# Patient Record
Sex: Female | Born: 1950 | Race: White | Hispanic: No | Marital: Single
Health system: Southern US, Community
[De-identification: ages and names within clinical notes are randomized; demographics above are authoritative.]

---

## 2003-12-09 ENCOUNTER — Other Ambulatory Visit: Payer: Self-pay

## 2004-04-16 ENCOUNTER — Other Ambulatory Visit: Payer: Self-pay

## 2004-10-14 ENCOUNTER — Observation Stay: Payer: Self-pay | Admitting: Internal Medicine

## 2005-03-09 ENCOUNTER — Inpatient Hospital Stay: Payer: Self-pay | Admitting: Anesthesiology

## 2005-04-04 ENCOUNTER — Emergency Department: Payer: Self-pay | Admitting: Emergency Medicine

## 2005-05-02 ENCOUNTER — Other Ambulatory Visit: Payer: Self-pay

## 2005-05-03 ENCOUNTER — Inpatient Hospital Stay: Payer: Self-pay

## 2005-05-31 ENCOUNTER — Emergency Department: Payer: Self-pay | Admitting: Emergency Medicine

## 2005-06-03 ENCOUNTER — Emergency Department: Payer: Self-pay | Admitting: Emergency Medicine

## 2007-12-03 ENCOUNTER — Other Ambulatory Visit: Payer: Self-pay

## 2007-12-03 ENCOUNTER — Emergency Department: Payer: Self-pay | Admitting: Emergency Medicine

## 2008-04-29 ENCOUNTER — Emergency Department: Payer: Self-pay | Admitting: Emergency Medicine

## 2008-04-29 ENCOUNTER — Other Ambulatory Visit: Payer: Self-pay

## 2009-09-13 ENCOUNTER — Emergency Department: Payer: Self-pay | Admitting: Emergency Medicine

## 2010-04-14 ENCOUNTER — Ambulatory Visit: Payer: Self-pay | Admitting: Internal Medicine

## 2010-04-28 ENCOUNTER — Inpatient Hospital Stay: Payer: Self-pay | Admitting: *Deleted

## 2010-05-28 ENCOUNTER — Ambulatory Visit: Payer: Self-pay | Admitting: Internal Medicine

## 2010-06-01 ENCOUNTER — Ambulatory Visit: Payer: Self-pay | Admitting: Internal Medicine

## 2010-06-04 ENCOUNTER — Ambulatory Visit: Payer: Self-pay | Admitting: Internal Medicine

## 2010-06-11 ENCOUNTER — Ambulatory Visit: Payer: Self-pay | Admitting: Internal Medicine

## 2010-07-09 ENCOUNTER — Ambulatory Visit: Payer: Self-pay | Admitting: Internal Medicine

## 2011-06-18 ENCOUNTER — Emergency Department: Payer: Self-pay | Admitting: Emergency Medicine

## 2012-03-13 ENCOUNTER — Ambulatory Visit: Payer: Self-pay | Admitting: Internal Medicine

## 2012-03-13 LAB — PRO B NATRIURETIC PEPTIDE: B-Type Natriuretic Peptide: 338 pg/mL — ABNORMAL HIGH (ref 0–125)

## 2012-03-13 LAB — CBC
HCT: 33 % — ABNORMAL LOW (ref 35.0–47.0)
MCHC: 33.9 g/dL (ref 32.0–36.0)
Platelet: 142 10*3/uL — ABNORMAL LOW (ref 150–440)
RBC: 3.72 10*6/uL — ABNORMAL LOW (ref 3.80–5.20)
WBC: 2.5 10*3/uL — ABNORMAL LOW (ref 3.6–11.0)

## 2012-03-13 LAB — COMPREHENSIVE METABOLIC PANEL
Alkaline Phosphatase: 117 U/L (ref 50–136)
Anion Gap: 10 (ref 7–16)
BUN: 20 mg/dL — ABNORMAL HIGH (ref 7–18)
Calcium, Total: 8.4 mg/dL — ABNORMAL LOW (ref 8.5–10.1)
Chloride: 111 mmol/L — ABNORMAL HIGH (ref 98–107)
Co2: 25 mmol/L (ref 21–32)
Creatinine: 1.18 mg/dL (ref 0.60–1.30)
EGFR (Non-African Amer.): 50 — ABNORMAL LOW
Glucose: 148 mg/dL — ABNORMAL HIGH (ref 65–99)
Osmolality: 296 (ref 275–301)
Potassium: 3.6 mmol/L (ref 3.5–5.1)
Sodium: 146 mmol/L — ABNORMAL HIGH (ref 136–145)
Total Protein: 6.7 g/dL (ref 6.4–8.2)

## 2012-03-13 LAB — TROPONIN I
Troponin-I: 0.04 ng/mL
Troponin-I: 0.06 ng/mL — ABNORMAL HIGH

## 2012-03-14 ENCOUNTER — Inpatient Hospital Stay: Payer: Self-pay | Admitting: Internal Medicine

## 2012-03-15 LAB — LACTATE DEHYDROGENASE: LDH: 250 U/L — ABNORMAL HIGH (ref 84–246)

## 2012-03-15 LAB — COMPREHENSIVE METABOLIC PANEL
Albumin: 2.6 g/dL — ABNORMAL LOW (ref 3.4–5.0)
Alkaline Phosphatase: 97 U/L (ref 50–136)
Anion Gap: 9 (ref 7–16)
BUN: 17 mg/dL (ref 7–18)
Creatinine: 1.08 mg/dL (ref 0.60–1.30)
EGFR (African American): >60
Osmolality: 289 (ref 275–301)
Potassium: 3.6 mmol/L (ref 3.5–5.1)
SGOT(AST): 74 U/L — ABNORMAL HIGH (ref 15–37)
SGPT (ALT): 40 U/L

## 2012-03-15 LAB — CBC WITH DIFFERENTIAL/PLATELET
Basophil %: 1 %
Basophil: 3 %
Eosinophil %: 3.9 %
HGB: 10.3 g/dL — ABNORMAL LOW (ref 12.0–16.0)
Lymphocyte #: 0.8 10*3/uL — ABNORMAL LOW (ref 1.0–3.6)
MCH: 30.4 pg (ref 26.0–34.0)
MCHC: 34.1 g/dL (ref 32.0–36.0)
Monocyte #: 0.4 10*3/uL (ref 0.0–0.7)
Monocyte %: 12.6 %
Monocytes: 14 %
Neutrophil #: 1.5 10*3/uL (ref 1.4–6.5)
RBC: 3.4 10*6/uL — ABNORMAL LOW (ref 3.80–5.20)
RDW: 16.1 % — ABNORMAL HIGH (ref 11.5–14.5)
WBC: 2.8 10*3/uL — ABNORMAL LOW (ref 3.6–11.0)

## 2012-03-15 LAB — IRON AND TIBC
Iron Bind.Cap.(Total): 305 ug/dL (ref 250–450)
Iron Saturation: 36 %
Iron: 110 ug/dL (ref 50–170)

## 2012-03-15 LAB — FOLATE: Folic Acid: 11.2 ng/mL (ref 3.1–100.0)

## 2012-03-15 LAB — LIPID PANEL
Cholesterol: 166 mg/dL (ref 0–200)
HDL Cholesterol: 39 mg/dL — ABNORMAL LOW (ref 40–60)
Ldl Cholesterol, Calc: 108 mg/dL — ABNORMAL HIGH (ref 0–100)
Triglycerides: 93 mg/dL (ref 0–200)

## 2012-03-15 LAB — RETICULOCYTES: Absolute Retic Count: 0.104 10*6/uL — ABNORMAL HIGH (ref 0.024–0.084)

## 2012-03-15 LAB — PROTIME-INR: Prothrombin Time: 15.1 secs — ABNORMAL HIGH (ref 11.5–14.7)

## 2012-03-15 LAB — FIBRINOGEN: Fibrinogen: 367 mg/dL (ref 210–470)

## 2012-03-17 LAB — BASIC METABOLIC PANEL
Anion Gap: 8 (ref 7–16)
BUN: 20 mg/dL — ABNORMAL HIGH (ref 7–18)
Calcium, Total: 8.1 mg/dL — ABNORMAL LOW (ref 8.5–10.1)
Co2: 29 mmol/L (ref 21–32)
Creatinine: 1.21 mg/dL (ref 0.60–1.30)
EGFR (African American): 58 — ABNORMAL LOW
EGFR (Non-African Amer.): 48 — ABNORMAL LOW
Glucose: 98 mg/dL (ref 65–99)
Osmolality: 286 (ref 275–301)
Potassium: 3.4 mmol/L — ABNORMAL LOW (ref 3.5–5.1)

## 2012-03-17 LAB — CBC WITH DIFFERENTIAL/PLATELET
HCT: 28.9 % — ABNORMAL LOW (ref 35.0–47.0)
HGB: 9.7 g/dL — ABNORMAL LOW (ref 12.0–16.0)
Lymphocyte #: 0.7 10*3/uL — ABNORMAL LOW (ref 1.0–3.6)
Monocyte %: 17.4 %
Platelet: 114 10*3/uL — ABNORMAL LOW (ref 150–440)
RBC: 3.25 10*6/uL — ABNORMAL LOW (ref 3.80–5.20)
RDW: 15.9 % — ABNORMAL HIGH (ref 11.5–14.5)
WBC: 2.6 10*3/uL — ABNORMAL LOW (ref 3.6–11.0)

## 2012-03-17 LAB — STOOL CULTURE

## 2012-03-28 ENCOUNTER — Ambulatory Visit: Payer: Self-pay | Admitting: Internal Medicine

## 2012-03-28 LAB — CBC CANCER CENTER
Basophil #: 0 x10 3/mm (ref 0.0–0.1)
Basophil %: 1 %
Eosinophil %: 3.9 %
HGB: 11.8 g/dL — ABNORMAL LOW (ref 12.0–16.0)
Lymphocyte #: 1 x10 3/mm (ref 1.0–3.6)
MCH: 29.4 pg (ref 26.0–34.0)
Monocyte %: 12.1 %
Neutrophil %: 60.5 %
Platelet: 147 x10 3/mm — ABNORMAL LOW (ref 150–440)
RBC: 4.01 10*6/uL (ref 3.80–5.20)

## 2012-04-12 ENCOUNTER — Ambulatory Visit: Payer: Self-pay | Admitting: Internal Medicine

## 2012-05-23 ENCOUNTER — Ambulatory Visit: Payer: Self-pay | Admitting: Emergency Medicine

## 2012-05-26 LAB — PATHOLOGY REPORT

## 2012-06-06 ENCOUNTER — Ambulatory Visit: Payer: Self-pay | Admitting: Emergency Medicine

## 2012-06-08 LAB — PATHOLOGY REPORT

## 2012-09-09 ENCOUNTER — Inpatient Hospital Stay: Payer: Self-pay | Admitting: Internal Medicine

## 2012-09-09 LAB — COMPREHENSIVE METABOLIC PANEL
Albumin: 3.1 g/dL — ABNORMAL LOW (ref 3.4–5.0)
Alkaline Phosphatase: 127 U/L (ref 50–136)
Anion Gap: 11 (ref 7–16)
Calcium, Total: 8.8 mg/dL (ref 8.5–10.1)
Chloride: 106 mmol/L (ref 98–107)
Creatinine: 0.92 mg/dL (ref 0.60–1.30)
EGFR (African American): >60
Glucose: 85 mg/dL (ref 65–99)
Potassium: 3.4 mmol/L — ABNORMAL LOW (ref 3.5–5.1)
SGOT(AST): 38 U/L — ABNORMAL HIGH (ref 15–37)
SGPT (ALT): 40 U/L (ref 12–78)

## 2012-09-09 LAB — CBC WITH DIFFERENTIAL/PLATELET
Basophil #: 0.2 10*3/uL — ABNORMAL HIGH (ref 0.0–0.1)
Basophil %: 3 %
Eosinophil %: 7.2 %
HCT: 39.4 % (ref 35.0–47.0)
HGB: 13.4 g/dL (ref 12.0–16.0)
Lymphocyte %: 32.3 %
MCH: 30.4 pg (ref 26.0–34.0)
MCV: 89 fL (ref 80–100)
Monocyte #: 0.7 x10 3/mm (ref 0.2–0.9)
Monocyte %: 10.9 %
Neutrophil %: 46.6 %
Platelet: 140 10*3/uL — ABNORMAL LOW (ref 150–440)

## 2012-09-09 LAB — TROPONIN I: Troponin-I: <0.02 ng/mL

## 2012-09-09 LAB — PRO B NATRIURETIC PEPTIDE: B-Type Natriuretic Peptide: 129 pg/mL — ABNORMAL HIGH (ref 0–125)

## 2012-09-10 LAB — BASIC METABOLIC PANEL
Anion Gap: 10 (ref 7–16)
BUN: 14 mg/dL (ref 7–18)
Chloride: 106 mmol/L (ref 98–107)
Creatinine: 0.9 mg/dL (ref 0.60–1.30)
EGFR (African American): >60
EGFR (Non-African Amer.): >60
Glucose: 130 mg/dL — ABNORMAL HIGH (ref 65–99)
Osmolality: 287 (ref 275–301)
Sodium: 143 mmol/L (ref 136–145)

## 2012-09-10 LAB — CBC WITH DIFFERENTIAL/PLATELET
Basophil #: 0 10*3/uL (ref 0.0–0.1)
Eosinophil #: 0 10*3/uL (ref 0.0–0.7)
HCT: 35.5 % (ref 35.0–47.0)
Lymphocyte %: 19.8 %
MCHC: 36.1 g/dL — ABNORMAL HIGH (ref 32.0–36.0)
Monocyte %: 2.4 %
Neutrophil #: 2.8 10*3/uL (ref 1.4–6.5)
Neutrophil %: 77.5 %
RDW: 14.4 % (ref 11.5–14.5)
WBC: 3.6 10*3/uL (ref 3.6–11.0)

## 2012-09-10 LAB — MAGNESIUM: Magnesium: 1.7 mg/dL — ABNORMAL LOW

## 2012-09-12 LAB — PLATELET COUNT: Platelet: 100 10*3/uL — ABNORMAL LOW (ref 150–440)

## 2012-09-22 ENCOUNTER — Ambulatory Visit: Payer: Self-pay | Admitting: Internal Medicine

## 2013-02-02 ENCOUNTER — Emergency Department: Payer: Self-pay | Admitting: Unknown Physician Specialty

## 2013-02-02 LAB — CBC
MCV: 88 fL (ref 80–100)
RBC: 4.14 10*6/uL (ref 3.80–5.20)
RDW: 16.4 % — ABNORMAL HIGH (ref 11.5–14.5)

## 2013-02-02 LAB — PRO B NATRIURETIC PEPTIDE: B-Type Natriuretic Peptide: 131 pg/mL — ABNORMAL HIGH (ref 0–125)

## 2013-02-02 LAB — BASIC METABOLIC PANEL
BUN: 10 mg/dL (ref 7–18)
Calcium, Total: 8.4 mg/dL — ABNORMAL LOW (ref 8.5–10.1)
Chloride: 108 mmol/L — ABNORMAL HIGH (ref 98–107)
Creatinine: 0.7 mg/dL (ref 0.60–1.30)
EGFR (Non-African Amer.): >60
Potassium: 3.8 mmol/L (ref 3.5–5.1)

## 2013-02-02 LAB — CK TOTAL AND CKMB (NOT AT ARMC): CK, Total: 65 U/L (ref 21–215)

## 2013-02-02 LAB — TROPONIN I: Troponin-I: <0.02 ng/mL

## 2013-05-30 IMAGING — US ABDOMEN ULTRASOUND LIMITED
1 series · 17 of 25 positions shown · non-contrast
Comparison: none

REASON FOR EXAM: pancytopenia, eval for cirrhosis or hepatosplenomegaly
COMMENTS:   Body Site: Liver; Spleen

[Series 1: abdomen ultrasound limited · 17 of 35 slices shown]
[im 1/35]
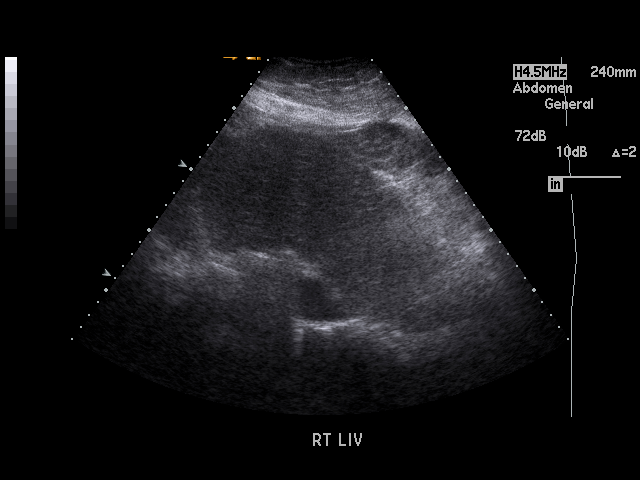
[im 3/35]
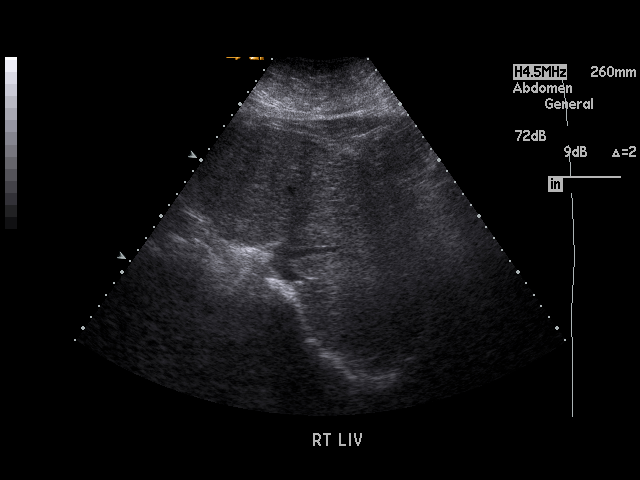
[im 5/35]
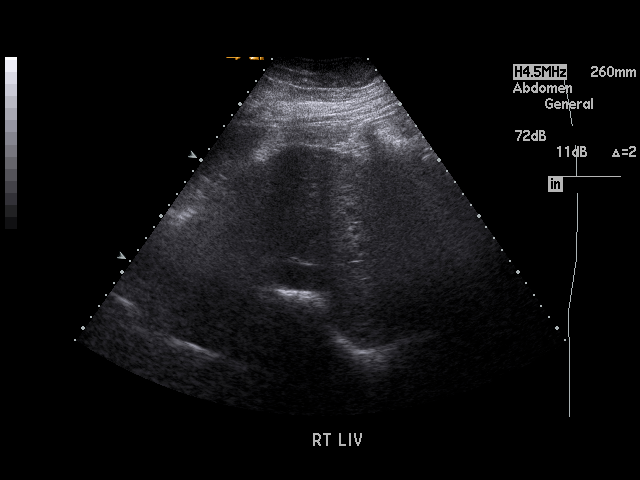
[im 8/35]
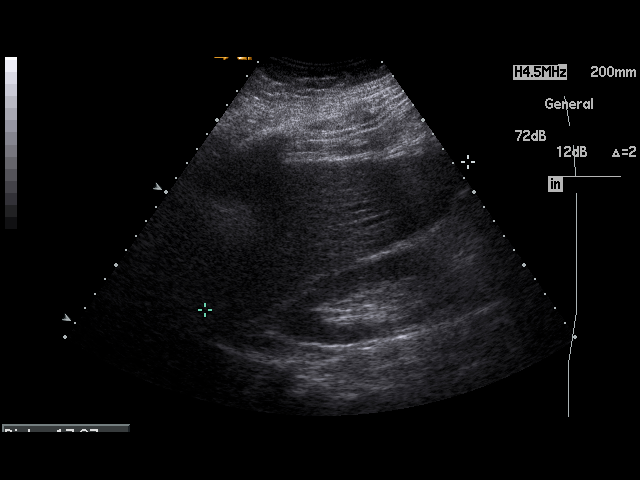
[im 9/35]
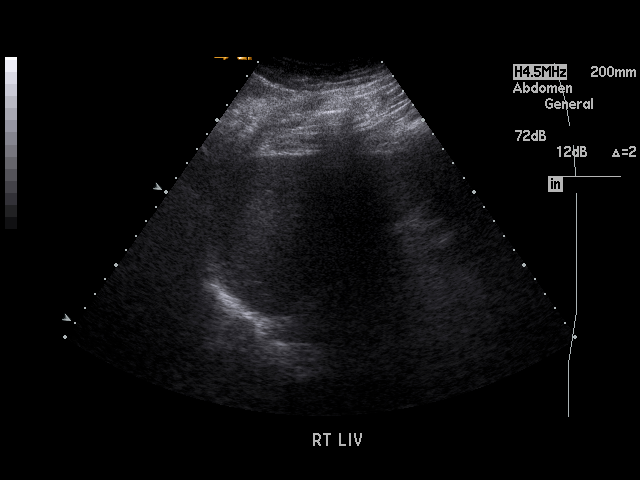
[im 12/35]
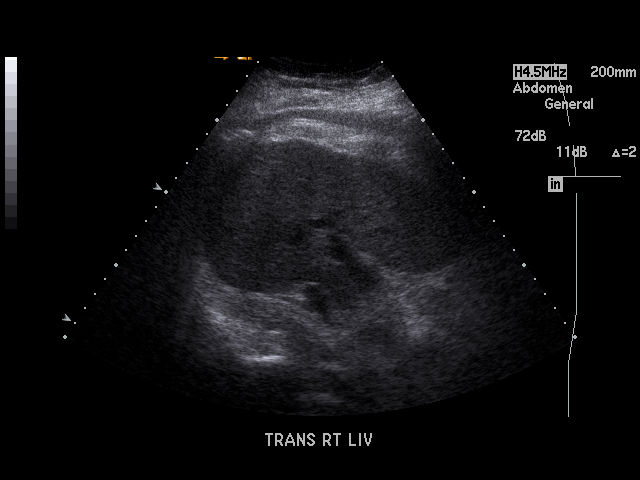
[im 13/35]
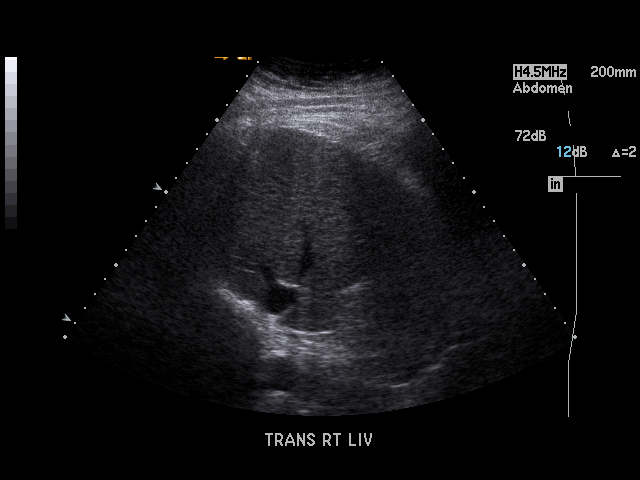
[im 16/35]
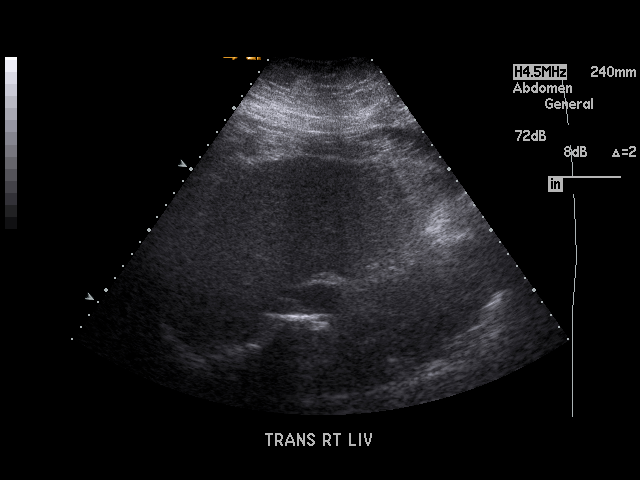
[im 18/35]
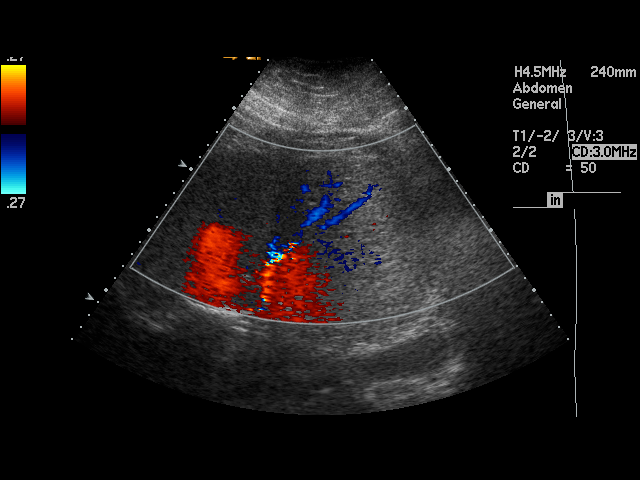
[im 19/35]
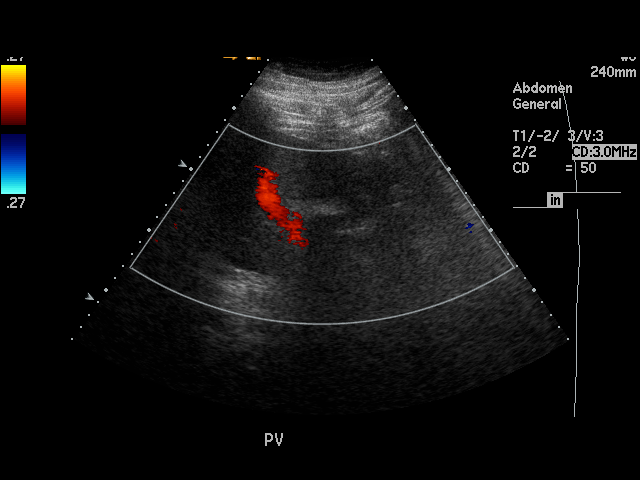
[im 22/35]
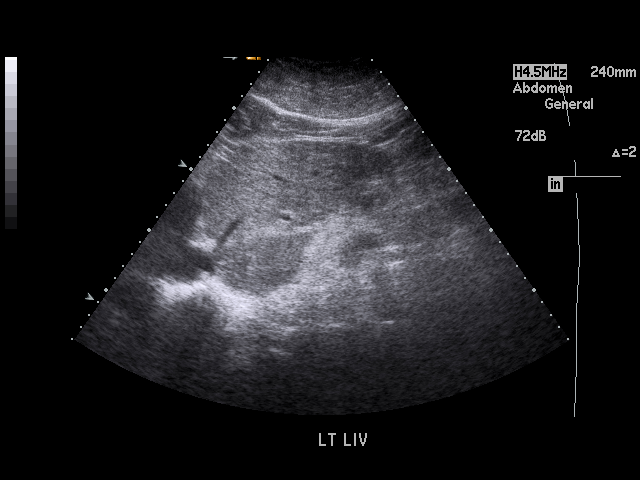
[im 23/35]
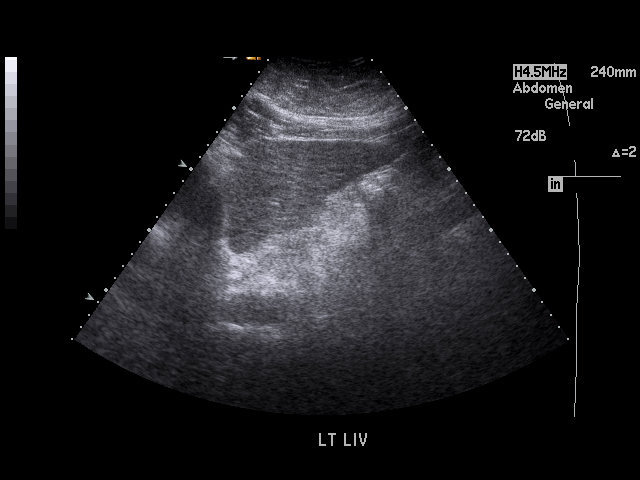
[im 26/35]
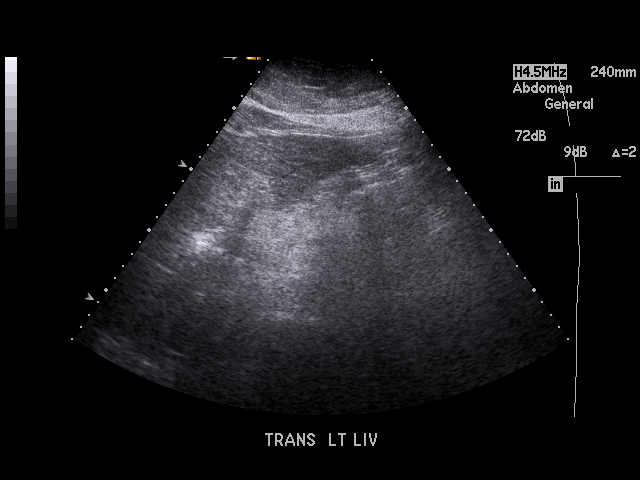
[im 27/35]
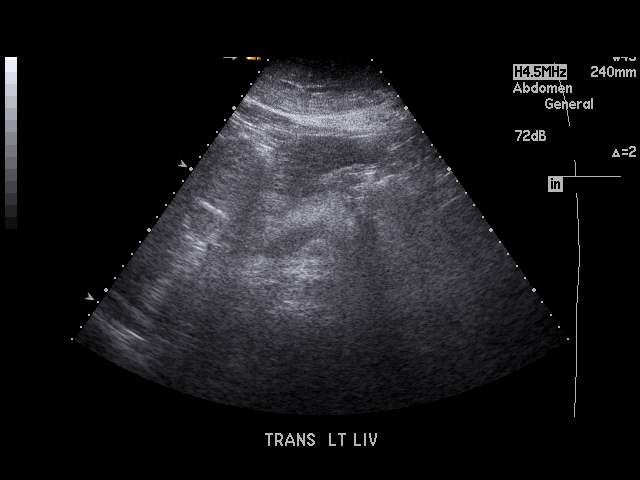
[im 30/35]
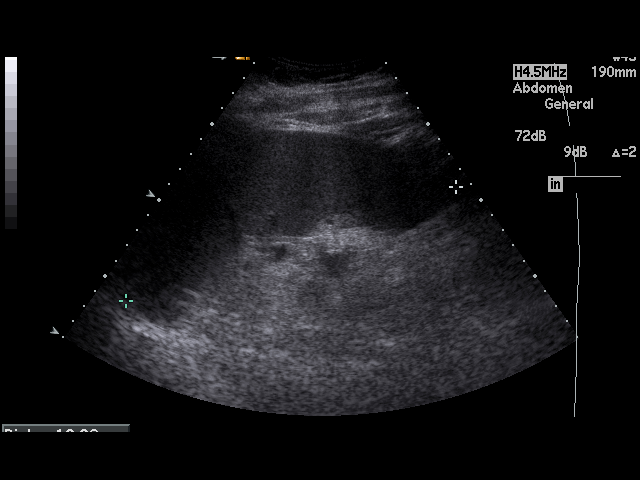
[im 32/35]
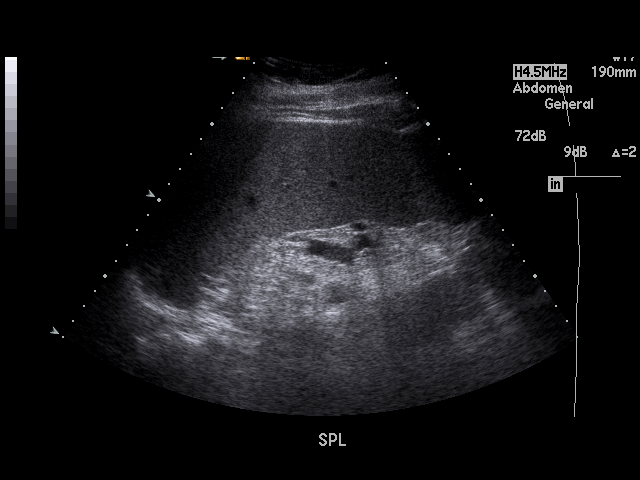
[im 35/35]
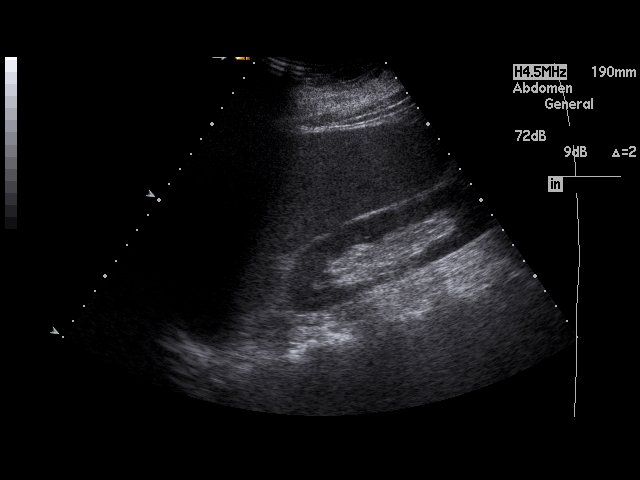

[17 of 25 positions shown; findings below may reference images not displayed]

PROCEDURE:     US  - US ABDOMEN LIMITED SURVEY  - March 15, 2012  [DATE]

RESULT:     The liver is heterogeneous in appearance with slightly lobulated
margins. The liver is within normal limits for size. The spleen is enlarged
measuring up to 18.8 cm. There is no focal hepatic mass or intrahepatic
biliary ductal dilation suggested. There does not appear to be ascites in
the visualized portions of the abdomen. This study is limited to evaluate
the spleen and liver.
IMPRESSION: 1. Heterogeneous echotexture the liver.
2. Splenomegaly.

## 2013-10-18 ENCOUNTER — Ambulatory Visit: Payer: Self-pay | Admitting: Physician Assistant

## 2014-02-20 ENCOUNTER — Inpatient Hospital Stay: Payer: Self-pay | Admitting: Internal Medicine

## 2014-02-20 LAB — CK TOTAL AND CKMB (NOT AT ARMC)
CK, Total: 57 U/L
CK-MB: 1.1 ng/mL (ref 0.5–3.6)

## 2014-02-20 LAB — BASIC METABOLIC PANEL
Anion Gap: 6 — ABNORMAL LOW (ref 7–16)
BUN: 6 mg/dL — ABNORMAL LOW (ref 7–18)
CALCIUM: 9.1 mg/dL (ref 8.5–10.1)
Chloride: 100 mmol/L (ref 98–107)
Co2: 34 mmol/L — ABNORMAL HIGH (ref 21–32)
Creatinine: 0.71 mg/dL (ref 0.60–1.30)
EGFR (African American): >60
Glucose: 86 mg/dL (ref 65–99)
OSMOLALITY: 276 (ref 275–301)
Potassium: 3 mmol/L — ABNORMAL LOW (ref 3.5–5.1)
Sodium: 140 mmol/L (ref 136–145)

## 2014-02-20 LAB — CBC
HCT: 35.8 % (ref 35.0–47.0)
HGB: 12.1 g/dL (ref 12.0–16.0)
MCH: 28.6 pg (ref 26.0–34.0)
MCHC: 33.7 g/dL (ref 32.0–36.0)
MCV: 85 fL (ref 80–100)
Platelet: 85 10*3/uL — ABNORMAL LOW (ref 150–440)
RBC: 4.21 10*6/uL (ref 3.80–5.20)
RDW: 15.7 % — AB (ref 11.5–14.5)
WBC: 4.4 10*3/uL (ref 3.6–11.0)

## 2014-02-20 LAB — TROPONIN I
Troponin-I: <0.02 ng/mL
Troponin-I: <0.02 ng/mL
Troponin-I: <0.02 ng/mL

## 2014-02-20 LAB — CK-MB
CK-MB: 1 ng/mL (ref 0.5–3.6)
CK-MB: 1 ng/mL (ref 0.5–3.6)

## 2014-02-20 LAB — AMMONIA: Ammonia, Plasma: 51 mcmol/L — ABNORMAL HIGH (ref 11–32)

## 2014-02-20 LAB — PRO B NATRIURETIC PEPTIDE: B-Type Natriuretic Peptide: 573 pg/mL — ABNORMAL HIGH (ref 0–125)

## 2014-02-20 LAB — POTASSIUM: Potassium: 3.1 mmol/L — ABNORMAL LOW (ref 3.5–5.1)

## 2014-02-20 LAB — MAGNESIUM: Magnesium: 1.3 mg/dL — ABNORMAL LOW

## 2014-02-21 LAB — CBC WITH DIFFERENTIAL/PLATELET
BASOS ABS: 0 10*3/uL (ref 0.0–0.1)
BASOS PCT: 0.1 %
EOS ABS: 0 10*3/uL (ref 0.0–0.7)
EOS PCT: 0 %
HCT: 31 % — AB (ref 35.0–47.0)
HGB: 10.5 g/dL — ABNORMAL LOW (ref 12.0–16.0)
LYMPHS ABS: 0.5 10*3/uL — AB (ref 1.0–3.6)
Lymphocyte %: 7 %
MCH: 28.7 pg (ref 26.0–34.0)
MCHC: 34 g/dL (ref 32.0–36.0)
MCV: 85 fL (ref 80–100)
MONO ABS: 0.4 x10 3/mm (ref 0.2–0.9)
MONOS PCT: 4.8 %
Neutrophil #: 6.5 10*3/uL (ref 1.4–6.5)
Neutrophil %: 88.1 %
PLATELETS: 82 10*3/uL — AB (ref 150–440)
RBC: 3.66 10*6/uL — ABNORMAL LOW (ref 3.80–5.20)
RDW: 16 % — ABNORMAL HIGH (ref 11.5–14.5)
WBC: 7.3 10*3/uL (ref 3.6–11.0)

## 2014-02-21 LAB — LIPID PANEL
CHOLESTEROL: 137 mg/dL (ref 0–200)
HDL Cholesterol: 20 mg/dL — ABNORMAL LOW (ref 40–60)
Ldl Cholesterol, Calc: 101 mg/dL — ABNORMAL HIGH (ref 0–100)
Triglycerides: 81 mg/dL (ref 0–200)
VLDL Cholesterol, Calc: 16 mg/dL (ref 5–40)

## 2014-02-21 LAB — BASIC METABOLIC PANEL
Anion Gap: 6 — ABNORMAL LOW (ref 7–16)
BUN: 12 mg/dL (ref 7–18)
CHLORIDE: 105 mmol/L (ref 98–107)
CREATININE: 0.88 mg/dL (ref 0.60–1.30)
Calcium, Total: 8.2 mg/dL — ABNORMAL LOW (ref 8.5–10.1)
Co2: 27 mmol/L (ref 21–32)
EGFR (Non-African Amer.): >60
Glucose: 147 mg/dL — ABNORMAL HIGH (ref 65–99)
Osmolality: 278 (ref 275–301)
POTASSIUM: 3.5 mmol/L (ref 3.5–5.1)
Sodium: 138 mmol/L (ref 136–145)

## 2014-02-21 LAB — TSH: Thyroid Stimulating Horm: 0.023 u[IU]/mL — ABNORMAL LOW

## 2014-02-21 LAB — MAGNESIUM: Magnesium: 3.1 mg/dL — ABNORMAL HIGH

## 2014-05-23 ENCOUNTER — Ambulatory Visit: Payer: Self-pay | Admitting: Gastroenterology

## 2014-10-28 ENCOUNTER — Ambulatory Visit: Payer: Self-pay | Admitting: Internal Medicine

## 2014-11-08 ENCOUNTER — Emergency Department: Payer: Self-pay | Admitting: Emergency Medicine

## 2014-11-08 LAB — CBC
HCT: 39.1 % (ref 35.0–47.0)
HGB: 12.7 g/dL (ref 12.0–16.0)
MCH: 30.1 pg (ref 26.0–34.0)
MCHC: 32.5 g/dL (ref 32.0–36.0)
MCV: 93 fL (ref 80–100)
PLATELETS: 170 10*3/uL (ref 150–440)
RBC: 4.22 10*6/uL (ref 3.80–5.20)
RDW: 14 % (ref 11.5–14.5)
WBC: 10.9 10*3/uL (ref 3.6–11.0)

## 2014-11-08 LAB — TROPONIN I

## 2014-11-08 LAB — BASIC METABOLIC PANEL
Anion Gap: 7 (ref 7–16)
BUN: 8 mg/dL (ref 7–18)
CALCIUM: 8.6 mg/dL (ref 8.5–10.1)
CHLORIDE: 106 mmol/L (ref 98–107)
CO2: 30 mmol/L (ref 21–32)
Creatinine: 0.77 mg/dL (ref 0.60–1.30)
EGFR (African American): >60
Glucose: 111 mg/dL — ABNORMAL HIGH (ref 65–99)
Osmolality: 284 (ref 275–301)
POTASSIUM: 3.9 mmol/L (ref 3.5–5.1)
SODIUM: 143 mmol/L (ref 136–145)

## 2014-12-12 ENCOUNTER — Ambulatory Visit: Payer: Self-pay | Admitting: Physician Assistant

## 2014-12-19 ENCOUNTER — Inpatient Hospital Stay: Payer: Self-pay | Admitting: Internal Medicine

## 2014-12-19 LAB — CBC WITH DIFFERENTIAL/PLATELET
BASOS PCT: 0.8 %
Basophil #: 0 10*3/uL (ref 0.0–0.1)
EOS ABS: 0.2 10*3/uL (ref 0.0–0.7)
EOS PCT: 2.9 %
HCT: 39.3 % (ref 35.0–47.0)
HGB: 12.8 g/dL (ref 12.0–16.0)
Lymphocyte #: 1 10*3/uL (ref 1.0–3.6)
Lymphocyte %: 17.2 %
MCH: 29.6 pg (ref 26.0–34.0)
MCHC: 32.6 g/dL (ref 32.0–36.0)
MCV: 91 fL (ref 80–100)
MONO ABS: 1 x10 3/mm — AB (ref 0.2–0.9)
Monocyte %: 18 %
Neutrophil #: 3.4 10*3/uL (ref 1.4–6.5)
Neutrophil %: 61.1 %
PLATELETS: 121 10*3/uL — AB (ref 150–440)
RBC: 4.33 10*6/uL (ref 3.80–5.20)
RDW: 13.3 % (ref 11.5–14.5)
WBC: 5.6 10*3/uL (ref 3.6–11.0)

## 2014-12-19 LAB — COMPREHENSIVE METABOLIC PANEL
ALBUMIN: 2.3 g/dL — AB (ref 3.4–5.0)
ALT: 39 U/L
AST: 45 U/L — AB (ref 15–37)
Alkaline Phosphatase: 161 U/L — ABNORMAL HIGH
Anion Gap: 5 — ABNORMAL LOW (ref 7–16)
BUN: 11 mg/dL (ref 7–18)
Bilirubin,Total: 1.1 mg/dL — ABNORMAL HIGH (ref 0.2–1.0)
Calcium, Total: 8.4 mg/dL — ABNORMAL LOW (ref 8.5–10.1)
Chloride: 98 mmol/L (ref 98–107)
Co2: 37 mmol/L — ABNORMAL HIGH (ref 21–32)
Creatinine: 0.8 mg/dL (ref 0.60–1.30)
EGFR (African American): >60
EGFR (Non-African Amer.): >60
GLUCOSE: 108 mg/dL — AB (ref 65–99)
Osmolality: 279 (ref 275–301)
Potassium: 3 mmol/L — ABNORMAL LOW (ref 3.5–5.1)
Sodium: 140 mmol/L (ref 136–145)
Total Protein: 5.9 g/dL — ABNORMAL LOW (ref 6.4–8.2)

## 2014-12-19 LAB — CK TOTAL AND CKMB (NOT AT ARMC)
CK, Total: 81 U/L (ref 26–192)
CK-MB: 0.8 ng/mL (ref 0.5–3.6)

## 2014-12-19 LAB — TROPONIN I: Troponin-I: 0.03 ng/mL

## 2014-12-20 LAB — COMPREHENSIVE METABOLIC PANEL
ANION GAP: 7 (ref 7–16)
AST: 43 U/L — AB (ref 15–37)
Albumin: 2 g/dL — ABNORMAL LOW (ref 3.4–5.0)
Alkaline Phosphatase: 132 U/L — ABNORMAL HIGH
BUN: 10 mg/dL (ref 7–18)
Bilirubin,Total: 0.7 mg/dL (ref 0.2–1.0)
CHLORIDE: 100 mmol/L (ref 98–107)
CO2: 32 mmol/L (ref 21–32)
Calcium, Total: 8.2 mg/dL — ABNORMAL LOW (ref 8.5–10.1)
Creatinine: 0.67 mg/dL (ref 0.60–1.30)
Glucose: 136 mg/dL — ABNORMAL HIGH (ref 65–99)
OSMOLALITY: 279 (ref 275–301)
Potassium: 3.4 mmol/L — ABNORMAL LOW (ref 3.5–5.1)
SGPT (ALT): 32 U/L
Sodium: 139 mmol/L (ref 136–145)
Total Protein: 5.3 g/dL — ABNORMAL LOW (ref 6.4–8.2)

## 2014-12-20 LAB — CBC WITH DIFFERENTIAL/PLATELET
BASOS PCT: 0.1 %
Basophil #: 0 10*3/uL (ref 0.0–0.1)
EOS PCT: 0 %
Eosinophil #: 0 10*3/uL (ref 0.0–0.7)
HCT: 36.5 % (ref 35.0–47.0)
HGB: 12 g/dL (ref 12.0–16.0)
Lymphocyte #: 0.5 10*3/uL — ABNORMAL LOW (ref 1.0–3.6)
Lymphocyte %: 18.5 %
MCH: 29.9 pg (ref 26.0–34.0)
MCHC: 32.7 g/dL (ref 32.0–36.0)
MCV: 91 fL (ref 80–100)
MONO ABS: 0.1 x10 3/mm — AB (ref 0.2–0.9)
Monocyte %: 4.3 %
NEUTROS ABS: 2.2 10*3/uL (ref 1.4–6.5)
NEUTROS PCT: 77.1 %
Platelet: 111 10*3/uL — ABNORMAL LOW (ref 150–440)
RBC: 4 10*6/uL (ref 3.80–5.20)
RDW: 13.1 % (ref 11.5–14.5)
WBC: 2.9 10*3/uL — ABNORMAL LOW (ref 3.6–11.0)

## 2014-12-20 LAB — MAGNESIUM
MAGNESIUM: 1.6 mg/dL — AB
MAGNESIUM: 2 mg/dL

## 2014-12-20 LAB — POTASSIUM: Potassium: 4 mmol/L (ref 3.5–5.1)

## 2014-12-20 LAB — TSH: THYROID STIMULATING HORM: 0.591 u[IU]/mL

## 2014-12-21 LAB — BASIC METABOLIC PANEL
Anion Gap: 4 — ABNORMAL LOW (ref 7–16)
BUN: 17 mg/dL (ref 7–18)
CALCIUM: 8.3 mg/dL — AB (ref 8.5–10.1)
CHLORIDE: 104 mmol/L (ref 98–107)
CO2: 33 mmol/L — AB (ref 21–32)
CREATININE: 0.86 mg/dL (ref 0.60–1.30)
EGFR (African American): >60
EGFR (Non-African Amer.): >60
Glucose: 147 mg/dL — ABNORMAL HIGH (ref 65–99)
Osmolality: 285 (ref 275–301)
Potassium: 4 mmol/L (ref 3.5–5.1)
Sodium: 141 mmol/L (ref 136–145)

## 2014-12-23 LAB — BASIC METABOLIC PANEL
Anion Gap: 7 (ref 7–16)
BUN: 17 mg/dL (ref 7–18)
CALCIUM: 8.3 mg/dL — AB (ref 8.5–10.1)
CO2: 33 mmol/L — AB (ref 21–32)
Chloride: 101 mmol/L (ref 98–107)
Creatinine: 0.83 mg/dL (ref 0.60–1.30)
EGFR (African American): >60
GLUCOSE: 159 mg/dL — AB (ref 65–99)
Osmolality: 286 (ref 275–301)
POTASSIUM: 3.9 mmol/L (ref 3.5–5.1)
Sodium: 141 mmol/L (ref 136–145)

## 2014-12-24 LAB — BASIC METABOLIC PANEL
ANION GAP: 5 — AB (ref 7–16)
BUN: 17 mg/dL (ref 7–18)
CALCIUM: 7.9 mg/dL — AB (ref 8.5–10.1)
Chloride: 100 mmol/L (ref 98–107)
Co2: 35 mmol/L — ABNORMAL HIGH (ref 21–32)
Creatinine: 0.92 mg/dL (ref 0.60–1.30)
EGFR (Non-African Amer.): >60
Glucose: 175 mg/dL — ABNORMAL HIGH (ref 65–99)
Osmolality: 285 (ref 275–301)
Potassium: 3.8 mmol/L (ref 3.5–5.1)
Sodium: 140 mmol/L (ref 136–145)

## 2014-12-26 LAB — BASIC METABOLIC PANEL
Anion Gap: 7 (ref 7–16)
BUN: 12 mg/dL (ref 7–18)
Calcium, Total: 7.9 mg/dL — ABNORMAL LOW (ref 8.5–10.1)
Chloride: 96 mmol/L — ABNORMAL LOW (ref 98–107)
Co2: 33 mmol/L — ABNORMAL HIGH (ref 21–32)
Creatinine: 0.71 mg/dL (ref 0.60–1.30)
EGFR (African American): >60
EGFR (Non-African Amer.): >60
Glucose: 118 mg/dL — ABNORMAL HIGH (ref 65–99)
Osmolality: 273 (ref 275–301)
Potassium: 3.6 mmol/L (ref 3.5–5.1)
Sodium: 136 mmol/L (ref 136–145)

## 2014-12-26 LAB — PROTIME-INR
INR: 1.3
PROTHROMBIN TIME: 16.2 s — AB (ref 11.5–14.7)

## 2014-12-26 LAB — CBC
HCT: 39.5 % (ref 35.0–47.0)
HGB: 12.8 g/dL (ref 12.0–16.0)
MCH: 29.1 pg (ref 26.0–34.0)
MCHC: 32.3 g/dL (ref 32.0–36.0)
MCV: 90 fL (ref 80–100)
Platelet: 116 10*3/uL — ABNORMAL LOW (ref 150–440)
RBC: 4.38 10*6/uL (ref 3.80–5.20)
RDW: 13.4 % (ref 11.5–14.5)
WBC: 15.2 10*3/uL — ABNORMAL HIGH (ref 3.6–11.0)

## 2014-12-26 LAB — APTT: ACTIVATED PTT: 29.5 s (ref 23.6–35.9)

## 2014-12-26 LAB — TROPONIN I: Troponin-I: >40 ng/mL

## 2014-12-27 ENCOUNTER — Inpatient Hospital Stay: Payer: Self-pay | Admitting: Internal Medicine

## 2014-12-27 LAB — CBC WITH DIFFERENTIAL/PLATELET
BASOS ABS: 0 10*3/uL (ref 0.0–0.1)
Basophil %: 0.1 %
EOS ABS: 0 10*3/uL (ref 0.0–0.7)
EOS PCT: 0.1 %
HCT: 38.6 % (ref 35.0–47.0)
HGB: 12.7 g/dL (ref 12.0–16.0)
LYMPHS ABS: 0.4 10*3/uL — AB (ref 1.0–3.6)
Lymphocyte %: 4.1 %
MCH: 29.5 pg (ref 26.0–34.0)
MCHC: 32.8 g/dL (ref 32.0–36.0)
MCV: 90 fL (ref 80–100)
MONO ABS: 0.5 x10 3/mm (ref 0.2–0.9)
Monocyte %: 4.7 %
Neutrophil #: 9.3 10*3/uL — ABNORMAL HIGH (ref 1.4–6.5)
Neutrophil %: 91 %
Platelet: 94 10*3/uL — ABNORMAL LOW (ref 150–440)
RBC: 4.29 10*6/uL (ref 3.80–5.20)
RDW: 13.6 % (ref 11.5–14.5)
WBC: 10.3 10*3/uL (ref 3.6–11.0)

## 2014-12-27 LAB — CK-MB
CK-MB: 200.8 ng/mL — ABNORMAL HIGH (ref 0.5–3.6)
CK-MB: 208.3 ng/mL — ABNORMAL HIGH (ref 0.5–3.6)
CK-MB: 234.4 ng/mL — ABNORMAL HIGH (ref 0.5–3.6)

## 2014-12-27 LAB — TROPONIN I
Troponin-I: >40 ng/mL
Troponin-I: >40 ng/mL

## 2014-12-27 LAB — HEPARIN LEVEL (UNFRACTIONATED): Anti-Xa(Unfractionated): 0.38 IU/mL (ref 0.30–0.70)

## 2015-01-13 DEATH — deceased

## 2015-04-01 NOTE — H&P (Signed)
PATIENT NAME:  Helen Carlson, [name] MR#:  [id number] DATE OF BIRTH:  09/27/1951  DATE OF ADMISSION:  09/09/2012  PRIMARY CARE PHYSICIAN: Freda MunroSaadat Khan, MD   REFERRING PHYSICIAN: Dr. Darnelle CatalanMalinda   CHIEF COMPLAINT: Cough, sputum, shortness of breath, wheezing for two weeks and worsening for one day.   HISTORY OF PRESENT ILLNESS: The patient is a 64 year old Caucasian female with a history of chronic obstructive pulmonary disease, congestive heart failure, diastolic dysfunction, hypertension, and anxiety who presented to the ED with the above chief complaint. The patient is alert, awake, oriented in no acute distress. According to the patient, she has had a cough, wheezing, and shortness of breath for about two weeks. She went to her primary care physician, Dr. Milta DeitersKhan's office, and got prednisone, antibiotic treatment, and nebulizers without relief. In addition, the symptoms have been worsening for the past one day so she came to the ED for further evaluation. In the ED she was treated with steroid and nebulizer. Symptoms are getting better. She denies any fever or chills. No headache or dizziness. No chest pain, palpitation, orthopnea, or nocturnal dyspnea. No leg edema.   PAST MEDICAL HISTORY:  1. Chronic obstructive pulmonary disease. 2. Diastolic congestive heart failure. 3. Clostridium difficile. 4. Pancytopenia. 5. Hypertension. 6. Anxiety.   SOCIAL HISTORY: The patient was a smoker but quit years ago. Denies any alcohol drinking or illicit drugs.   PAST SURGICAL HISTORY: None.   FAMILY HISTORY: No obvious family history.   ALLERGIES: Penicillin.   HOME MEDICATIONS:  1. Xanax 0.25 mg p.o. b.i.d. p.r.n.  2. Spiriva inhaler every day. 3. Singulair 10 mg p.o. daily. 4. Omeprazole 40 mg p.o. daily.  5. Naproxen 375 mg p.o. b.i.d.  6. methocarbamol 500 mg p.o. q.8 hours p.r.n.  7. Lasix 40 mg p.o. b.i.d.  8. DuoNebs p.r.n.  9. Coreg 3.125 mg p.o. tablets twice daily.  10. Advair 250 mcg/50 mcg  inhalation 1 puff b.i.d.   REVIEW OF SYSTEMS: CONSTITUTIONAL: The patient denies any fever or chills. No headache or dizziness but has generalized weakness. EYES: No double vision, blurred vision. ENT: No epistaxis, postnasal drip, slurred speech, or dysphagia. CARDIOVASCULAR: No chest pain, palpitation, orthopnea, or nocturnal dyspnea. No leg edema. PULMONARY: Positive for cough, sputum, shortness of breath, wheezing. No hemoptysis. GI: No abdominal pain, nausea, vomiting, diarrhea. No melena or bloody stool. GU: No dysuria, hematuria, or incontinence. SKIN: No rash or jaundice. MUSCULOSKELETAL: No joint pain. No edema. ENDOCRINE: No polyuria or polydipsia. NEUROLOGY: No syncope, loss of consciousness, or seizure.   PHYSICAL EXAMINATION:   VITAL SIGNS: Temperature 97.3, blood pressure 133/63, pulse 70, respirations 20, oxygen saturation 93% on 2 liters by nasal cannula.   GENERAL: The patient is alert, awake, oriented in no acute distress.   HEENT: Pupils round, equal, reactive to light and accommodation. Moist oral mucosa. Clear oropharynx.   NECK: Supple. No JVD or carotid bruit. No lymphadenopathy. No thyromegaly.   CARDIOVASCULAR: S1, S2 regular rate and rhythm. No murmurs or gallops.   PULMONARY: Bilateral air entry. Bilateral moderate wheezing. No crackles or rales. No use of accessory muscles to breathe.   ABDOMEN: Soft, obese. No distention or tenderness. No organomegaly. Bowel sounds present.   EXTREMITIES: No edema, clubbing, or cyanosis. No calf tenderness. Bilateral strong pedal pulses.   NEUROLOGY: Alert and oriented x3. No focal deficit. Power 5/5. Sensation intact. Deep tendon reflexes 2+.   LABORATORY DATA: CBC WBC 6.3, hemoglobin 13.4, platelets 140, BUN 13, creatinine 0.92, glucose 85, sodium  145, potassium 3.4, chloride 106. Troponin less than 0.02. BNP 129.   Chest x-Whitmyer showed no acute disease of the chest area.   EKG showed sinus rhythm with premature  supraventricular complexes at 61 beats per minute.   IMPRESSION:  1. Chronic obstructive pulmonary disease exacerbation.  2. Hypokalemia.  3. Hypertension, controlled.  4. Congestive heart failure, diastolic dysfunction, stable.  5. Thrombocytopenia.  6. Obesity.   PLAN OF TREATMENT:  1. The patient will be admitted to medical floor.  2. We will continue O2 by nasal cannula.  3. Start Solu-Medrol IV and start Zithromax IVPB  4. We will continue Advair, Spiriva, Singulair, and give DuoNebs.  5. Will continue Coreg and Lasix.  6. For hypokalemia, we will give Klor-Con and follow-up BMP and magnesium level.  7. GI and DVT prophylaxis.   Discussed the patient's situation and the plan of treatment with the patient.     TIME SPENT: About 55 minutes   ____________________________ Shaune Pollack, MD qc:drc D: 09/09/2012 17:24:50 ET T: 09/10/2012 07:55:35 ET JOB#: [id number]  cc: Shaune Pollack, MD, <Dictator> Yevonne Pax, MD Shaune Pollack MD ELECTRONICALLY SIGNED 09/10/2012 20:53

## 2015-04-01 NOTE — Discharge Summary (Signed)
PATIENT NAME:  Helen Carlson, Mahreen M MR#:  [id number] DATE OF BIRTH:  Sep 23, 1951  DATE OF ADMISSION:  09/09/2012 DATE OF DISCHARGE:  09/12/2012  DISCHARGE DIAGNOSES:  1. Chronic obstructive pulmonary disease exacerbation, now improving and close to baseline.  2. Hypokalemia/hypomagnesemia, replaced and resolved.  3. Chronic congestive heart failure (diastolic dysfunction), now stable on Lasix and Coreg.   SECONDARY DIAGNOSES:  1. Chronic obstructive pulmonary disease.  2. Diastolic heart failure. 3. Clostridium difficile.  4. Pancytopenia.    5. Hypertension.  6. Anxiety.    CONSULTATIONS: None.   PROCEDURES/RADIOLOGY:  1. Chest x-Lacson on September 28th at 10:44 showed presence of small pleural effusion and/or airspace disease.   2. Chest x-Ferrelli on September 28th at 14:24 showed no focal parenchymal opacities.   History and short hospital course: The patient is a 64 year old female with the above-mentioned medical problems who was admitted for COPD. She was started on IV steroids, Zithromax, Advair, Spiriva, and nebulizer breathing treatment. Please see Dr. Nicky Pughhen's dictated history and physical for further details. She was slowly improving on above regimen. She had low electrolyte (potassium and magnesium). They were replaced and resolved. She was doing much better and was discharged home in stable condition.   On the date of discharge her vital signs were as follows: Temperature 98, heart rate 56 per minute, respirations 18 per minute, blood pressure 125/69 mmHg. She was saturating 93% on room air at rest and 90% on room air with exertion.   PERTINENT PHYSICAL EXAMINATION ON THE DATE OF DISCHARGE:  CARDIOVASCULAR: S1, S2 normal. No murmurs, rubs, or gallops. LUNGS: Clear to auscultation bilaterally. No wheezing, rales, rhonchi, or crepitation. ABDOMEN: Soft, benign. NEUROLOGIC: Nonfocal examination. All other physical examination remained at baseline.   DISCHARGE MEDICATIONS:  1. Advair 250/50 one  puff b.i.d.  2. DuoNeb every six hours as needed.  3. Coreg 3.125 mg p.o. b.i.d.  4. Lasix 40 mg p.o. b.i.d.  5. Xanax 0.25 mg p.o. b.i.d. as needed.  6. Omeprazole 40 mg p.o. daily.  7. Singulair once daily.  8. Methocarbamol 500 mg p.o. every eight hours as needed.  9. Naprosyn 375 mg p.o. b.i.d.  10. Prednisone 60 mg p.o. daily, taper 10 mg daily until finished.  11. Spiriva once daily.  12. Zithromax 500 mg p.o. daily for two more days.   DISCHARGE DIET: Low sodium.   DISCHARGE ACTIVITY: As tolerated.   DISCHARGE INSTRUCTIONS AND FOLLOW-UP:  1. The patient was instructed to [facility] 1 to 2 weeks.  2. She will need follow-up with Pulmonary, Dr. Freda MunroSaadat Khan, in 2 to 4 weeks.  3. She was instructed to use 2 liters oxygen via nasal cannula at night only.   TOTAL TIME DISCHARGING THIS PATIENT: 55 minutes.   ____________________________ Ellamae SiaVipul S. Sherryll BurgerShah, MD vss:drc D: 09/12/2012 15:53:32 ET T: 09/13/2012 11:34:03 ET JOB#: [id number]  cc: Myya Meenach S. Sherryll BurgerShah, MD, <Dictator>, [facility], Yevonne PaxSaadat A. Khan, MD Ellamae SiaVIPUL S [facility] MD ELECTRONICALLY SIGNED 09/13/2012 14:39

## 2015-04-05 NOTE — Consult Note (Signed)
Brief Consult Note: Diagnosis: 64 yo female with history of copd  admitted with shorteness of breath  Noted to be in afib with rvr. Ruled out for mi.   Patient was seen by consultant.   Recommend further assessment or treatment.   Orders entered.   Comments: Pt with copd and afib admitted with sob and afib with rvr. RUled out for mi. Rate controlled with iv cardizem. Less short of breath. Echo done witih in a year revealed preserved lv function. CXR unremarkable for chf. No chest pain. Will convert to po cardizem and transfer to telemetry. WIll defer consideratiion for long term anticoagulation until time of discharge due to relative confusion at present. OK to transfer to 2A with tele.  Electronic Signatures: Dalia HeadingFath, Ashlley Booher A (MD)  (Signed 11-Mar-15 18:06)  Authored: Brief Consult Note   Last Updated: 11-Mar-15 18:06 by Dalia HeadingFath, Roldan Laforest A (MD)

## 2015-04-05 NOTE — Discharge Summary (Signed)
PATIENT NAME:  Helen Carlson, Helen Carlson MR#:  [id number] DATE OF BIRTH:  [birth year]  DATE OF ADMISSION:  02/20/2014 DATE OF DISCHARGE:  02/21/2014  PRESENTING COMPLAINT: Shortness of breath.   DISCHARGE DIAGNOSES:  1.  Acute chronic obstructive pulmonary disease exacerbation, improved.  2.  Atrial fibrillation with rapid ventricular response. Heart rate improved. Defer anticoagulation as outpatient for cardiology recommendation.  3.  Tobacco abuse. 4.  Anxiety. 5.  History of hypertension.  6.  History of hepatitis C.  7.  Confusion, resolved.  8.  Saturation 96% room air.   CODE STATUS: Full code.   MEDICATIONS:  1.  Advair 250/50 one puff b.i.d.  2.  Coreg 3.125 b.i.d.  3.  Lasix 40 mg b.i.d.  4.  Xanax 0.25 p.o. b.i.d. as needed.  5.  Omeprazole 40 mg daily.  6.  Singulair 10 mg daily.  7.  Methocarbamol 500 mg 1 tablet every 8 hours as needed.  8.  Spiriva 18 mcg inhalation daily.  9.  Diltiazem CD 120 mg p.o. daily.  10.  Levaquin 750 p.o. daily.  11.  Prednisone taper.  12.  Aspirin 81 mg daily.   DIET: Low sodium diet.   FOLLOWUP:  1.  With Dr. Beverely RisenFozia Khan in 1 to 2 weeks.  2.  With Dr. Lady GaryFath in 1 to 2 weeks.   LABORATORY DATA: At discharge: Cholesterol 137, triglyceride 81, LDL is 101. Troponin is less than 0.02.  Magnesium is 3.1. White count is 4.4, hemoglobin and hematocrit 12.1 and 35.8.  platelet count  is 573.  CARDIOLOGY CONSULTATION: [name], MD  BRIEF SUMMARY OF HOSPITAL COURSE:  Helen Carlson is a 64 year old Caucasian female with ongoing tobacco abuse, history of COPD,  and comes in with 5-day history of shortness of breath, cough, and wheezing who was admitted with acute/chronic respiratory failure secondary to COPD exacerbation. She was started on IV Solu-Medrol, antibiotics, nebulizers around-the-clock. Her saturations were 97% on room air. No wheezing. The patient did have some confusion earlier; however, per her sisters she appears to be okay. She is not hearing  any voices. Will taper steroids quickly given her confusion. She will finish up the antibiotic course as outpatient.  1.  Atrial fibrillation with RVR. Heart rate now controlled on Coreg and Cardizem. Continue aspirin for now. Seen by Dr. Lady GaryFath. The patient will follow up as outpatient once her respiratory episodes subsides.Defer anticoagulation as outpatient.  2.  COPD exacerbation. Continue p.o. steroids with Advair, Spiriva, Singulair, DuoNebs.  3.  Hypokalemia and hypomagnesium, repleted.  4.  Hypertension, controlled.  5.  CHF,  diastolic, well compensated. Continue Coreg and Lasix as before.  6.  History of hepatitis C with thrombocytopenia, which appears chronic and stable. The patient is recommended [facility].  7.  Hospital stay otherwise remained stable. The patient remained a full code.   TIME SPENT: 40 minutes.  ____________________________ Wylie HailSona A. Allena KatzPatel, MD sap:am D: 02/22/2014 06:43:40 ET T: 02/22/2014 10:00:45 ET JOB#: [id number]  cc: [name], MD, <Dictator> [name], MD [name], MD   Willow OraSONA A Kalik Hoare MD ELECTRONICALLY SIGNED 03/01/2014 10:42

## 2015-04-05 NOTE — H&P (Signed)
PATIENT NAME:  Helen Carlson, Helen Carlson MR#:  [id number] DATE OF BIRTH:  [birth year]  DATE OF ADMISSION:  02/20/2014  PRIMARY CARE PHYSICIAN: Dr. Carolynne EdouardSadaat Khan.   REFERRING PHYSICIAN: Dr. Zenda AlpersWebster.   CHIEF COMPLAINT: Shortness of breath for five days.   HISTORY OF PRESENT ILLNESS: The patient is a 64 year old female with a past medical history of COPD, congestive heart failure, hypertension, pancytopenia and anxiety who is presenting to the ER with a chief complaint of five day history of shortness of breath. Yesterday evening the patient was extremely short of breath and wheezing. She has been coughing also. The patient is brought into the ER, she was diagnosed with acute exacerbation of COPD. She was given IV Solu-Medrol and nebulizer treatments. One dose of IV antibiotics were also given. After breathing treatments, the patient became tachycardic and went into atrial fibrillation with RVR. Cardizem 10 mg IV bolus was given with no significant improvement and eventually the patient was started on Cardizem drip. Hospitalist team is called to admit the patient. The patient has intermittent episodes of confusion. Ammonia level is ordered, which is pending. At one point, she had reported that she has hepatitis C and [facility] as an outpatient regarding her hepatitis C. No family members are available. Denies any chest pain. No abdominal pain. Denies fever.   PAST MEDICAL HISTORY: COPD, diastolic congestive heart failure. History of C. diff. pancytopenia,  hypertension, anxiety.   PAST SURGICAL HISTORY: None.  ALLERGIES: PENICILLIN.   PSYCHOSOCIAL HISTORY: Lives at home. Used to smoke, but quit smoking several years ago. No history of alcohol or illicit drug usage.   FAMILY HISTORY: Hypertension runs in her family.  REVIEW OF SYSTEMS:  CONSTITUTIONAL:  Denies fevers. Complaining of fatigue.  EYES: Denies blurry vision, double vision.  ENT: Denies epistaxis, discharge.  RESPIRATION: Denies hemoptysis,  but complaining of cough, wheezing, COPD.  CARDIOVASCULAR: No chest pain, palpitations.  GASTROINTESTINAL: Denies nausea, vomiting, diarrhea and abdominal pain.  GENITOURINARY: No dysuria.  ENDOCRINE: Denies polyuria, nocturia, thyroid problems. HEMATOLOGIC AND LYMPHATIC: No anemia, but has thrombocytopenia. No bleeding.  INTEGUMENTARY: No acne, rash, lesions.  MUSCULOSKELETAL: No joint pain in the neck and back.  NEUROLOGICAL:  Denies any vertigo, ataxia.  PSYCHIATRIC: No ADD, OCD.   PHYSICAL EXAMINATION: VITAL SIGNS: Temperature 97.9, pulse 120, respirations 22, blood pressure 128/51, pulse oximetry 96% on 3 liters.  GENERAL APPEARANCE: Not in acute distress. Moderately built and obese.  HEENT: Normocephalic, atraumatic. Pupils are equally reacting to light and accommodation. No scleral icterus. No conjunctival injection. No sinus tenderness. Nares are congested. No postnasal drip.  NECK: Supple. No JVD or thyromegaly.  LUNGS: Coarse bronchial  breath sounds, decreased air entry. Diffuse wheezing is present. No crackles.  CARDIOVASCULAR: Irregularly irregular.  GASTROINTESTINAL: Soft, obese. Bowel sounds are positive in all four quadrants. Nontender, nondistended. No hepatosplenomegaly. No masses felt.  NEUROLOGIC: Awake and alert, oriented x 2 to 3.  EXTREMITIES: No edema. No cyanosis. No clubbing.  SKIN: Warm and  dry, normal turgor. No rashes. No lesions.  MUSCULOSKELETAL: No joint effusion, tenderness, erythema.  PSYCHIATRIC: Flat mood and affect.   LABORATORY AND IMAGING STUDIES: Accu-Chek 129 and 119. BMP: Glucose 86,BNP 573, BUN 6, creatinine normal. Sodium 140, potassium 3.2, chloride 100, CO2 34, anion gap is 5 . GFR greater than 60, serum osmolality 276, calcium 9.1. Ammonia is 51, CPK MB 1.0, troponin less than 0.02 x 2. White count is normal at 4.4, hemoglobin 12.1, hematocrit 35.8, platelets 85, MCV 85.  A 12-lead EKG: Atrial fibrillation at 143 beats per minute, left axis  deviation, nonspecific intraventricular block.   Portable chest x-Ballman, no acute cardiopulmonary abnormalities seen.   ASSESSMENT AND PLAN: A 64 year old female presenting to the ER with a chief complaint of five-day history of shortness of breath, cough and wheezing will be admitted with following assessment and plan.  1. Acute respiratory failure secondary to acute illness and chronic obstructive pulmonary disease. We will admit her to Critical Care Unit. IV Solu-Medrol will be given. The patient will be on antibiotics and nebulizer treatments. We will avoid albuterol in her case as the patient went into atrial fibrillation with rapid ventricular response following albuterol treatment. The patient will be on  Atrovent nebulizer treatments q.6h. and Xopenex nebulizer treatment every 4 to 6 hours as needed for shortness of breath.  2. Atrial fibrillation with rapid ventricular response after albuterol nebulizers. We will admit her to Critical Care Unit and Cardizem drip. Cardiology consult is placed.  3. Thrombocytopenia, probably from hepatitis C. The patient follows up with [facility] as an outpatient. The patient was recommended to continue following up with [facility]. At this time,  no bleeding or bruising. We will avoid antiplatelet medications. 4. Chronic history of congestive heart failure. Not fluid overloaded at this time. We will resume her home medications after med reconciliation.  5. Hypertension. Resume home medications. Provide gastrointestinal and deep vein thrombosis prophylaxis with SCDs in view of thrombocytopenia.   TOTAL CRITICAL CARE TIME SPENT: 50 minutes.    ____________________________ Ramonita Lab, MD ag:sg D: 02/20/2014 07:39:00 ET T: 02/20/2014 08:11:43 ET JOB#: [id number]  cc: Ramonita Lab, MD, <Dictator> Ramonita Lab MD ELECTRONICALLY SIGNED 03/14/2014 2:45

## 2015-04-06 NOTE — Discharge Summary (Signed)
PATIENT NAME:  Helen Carlson, Helen Carlson MR#:  [id number] DATE OF BIRTH:  July 18, 1951  DATE OF ADMISSION:  03/14/2012 DATE OF DISCHARGE:  03/18/2012  DISCHARGE DIAGNOSES:  1. Acute diastolic congestive heart failure.  2. Elevated troponin with demand ischemia.  3. Chronic obstructive pulmonary disease exacerbation.  4. Clostridium difficile colitis.  5. Elevated liver function tests with suspected liver disease.  6. Pancytopenia  7. Hypertension.  8. Anxiety.   DISCHARGE MEDICATIONS: 1. Advair 250/50, 1 puff b.i.d.  2. Spiriva 1 puff daily.  3. DuoNebs q.6 hours p.r.n.  4. Coreg 3.125 mg b.i.d.  5. Lasix 40 mg b.i.d.  6. Xanax 0.25 mg b.i.d. p.r.n.  7. Flagyl 400 mg q.i.d.   REASON FOR ADMISSION: This is a 64 year old female who has a history of chronic obstructive pulmonary disease. She has had increasing leg swelling and increasing shortness of breath. She has also had some chest pain so she was admitted for further evaluation.   HOSPITAL COURSE:  1. Acute systolic heart failure. Patient was found to be in failure, mainly right sided probably from her pulmonary condition. Echocardiogram showed an ejection fraction of 55% with some diastolic dysfunction. She was started on beta blocker. She was diuresed, tolerated well.  2. Chronic obstructive pulmonary disease exacerbation. She was treated with bronchodilators and this resolved.  3. Clostridium difficile colitis. She started developing diarrhea after admission and stool tested positive for Clostridium difficile colitis. She did say she had been on some antibiotics recently. She is going to finish a full course of Flagyl.  4. Pancytopenia. She was seen by hematology and underwent work up. They were suspicious of liver disease consulted GI and their thoughts are possible cirrhosis and will follow up with her as an outpatient.  5. Elevated liver function tests. Again this is thought to be liver disease. She will follow up with GI as an outpatient.   6. Hypertension. Since she is now on Lasix and carvedilol we discontinued her amlodipine.   DISPOSITION: Patient is discharged in stable condition.   FOLLOW UP: She will follow up with Dr. Welton FlakesKhan in 1 to 2 weeks and also Dr. Marva PandaSkulskie.   TIME SPENT ON DISCHARGE: 35 minutes.  ____________________________ Gracelyn NurseJohn D. Johnston, MD jdj:cms D: 03/18/2012 08:07:57 ET T: 03/20/2012 12:47:27 ET JOB#: [id number]  cc: Gracelyn NurseJohn D. Johnston, MD, <Dictator> Yevonne PaxSaadat A. Khan, MD Marcina MillardAlexander Paraschos, MD Christena DeemMartin U. Skulskie, MD Maren ReamerSandeep R. Sherrlyn HockPandit, MD Gracelyn NurseJOHN D JOHNSTON MD ELECTRONICALLY SIGNED 03/30/2012 23:30

## 2015-04-06 NOTE — Consult Note (Signed)
Brief Consult Note: Diagnosis: Borderline elevated troponin, probable demand/supply ischemia, not due to ACS.   Patient was seen by consultant.   Consult note dictated.   Comments: REC  Agree with current therapy, cont diuresis, defer full dose anticoagulation, may consider functional study, could perform as out-patient when patient euvolemic.  Electronic Signatures: Marcina MillardParaschos, Nemesis Rainwater (MD)  (Signed 02-Apr-13 18:04)  Authored: Brief Consult Note   Last Updated: 02-Apr-13 18:04 by Marcina MillardParaschos, Kaiden Dardis (MD)

## 2015-04-06 NOTE — Consult Note (Signed)
PATIENT NAME:  Helen Carlson, Helen Carlson MR#:  [id number] DATE OF BIRTH:  [birth year]  DATE OF CONSULTATION:  03/17/2012  REFERRING PHYSICIAN:   CONSULTING PHYSICIAN:  Keturah Barre, NP  HISTORY OF PRESENT ILLNESS: Helen Carlson is a pleasant 64 year old Caucasian female admitted with leg swelling and possible acute coronary syndrome, due to some chest pain. Please refer to admission history and physical details. We appreciate cardiac consult and hematology consult. Helen Carlson has a history significant for chronic obstructive pulmonary disease and gastroesophageal reflux disease. Gastroenterology has been consulted at the request of Dr. Letitia Libra for evaluation of his anemia and liver disease. Dr. Janese Banks has done thorough evaluation of anemia and plans to continue this on an outpatient basis, as per his note. Regarding the patient's liver disease, she has not been aware that she had any issues with her liver. Incidentally, was found to have C-diff here- was having NVD at beginning of admission. Is being treated with Flagyl.  At present she has absolutely no GI complaints. Her lab work has demonstrated some mild elevation of AST and heterogenous-appearing liver with lobulated margins on limited ultrasound. Splenomegaly is also present. She does describe, just prior to admission, some general body swelling to include her abdomen and her legs. She states it included everything except her arms. She states that her abdomen became tight and hard, but she appeared pregnant and gained about 20 pounds in what felt like occurred overnight. This has been relieved at present and she is feeling better. She has had diuretic therapy while here in the hospital. There is no known history of liver disease or family liver disease, military service, health care work, blood transfusion, IVDU, intranasal cocaine, or incarceration. She does have a tattoo performed between the years of 1945 and 1965.  States she has very rare EtOH consumption  and denies diabetes. She states she has never had her lipids tested. No episodes of jaundice or confusion. She states her current proton pump inhibitor controls her GERD very well, feeling well today. No history of luminal evaluation. Denies bloody, black tarry stool or any type of melena.   PAST MEDICAL HISTORY:  1. Gastroesophageal reflux disease. 2. Chronic obstructive pulmonary disease. 3. Hypertension. 4. Anxiety. 5. Arthritis.   PAST SURGICAL HISTORY: None.  ALLERGIES: Penicillin.   SOCIAL HISTORY: Single, lives alone. States her granddaughter is going to move in with her soon. Currently unemployed. Smokes approximately two cigarettes daily. States greater EtOH. Denies illicits.   FAMILY HISTORY: Questionable for blood clots. Denies colorectal cancer, liver disease, or peptic ulcer disease.  REVIEW OF SYSTEMS: CONSTITUTIONAL: Negative for fever, chills. Does report some fatigue. States this is unchanged. HEENT: No complaints of headaches, sinus congestion, nasal drainage, hearing loss, tinnitus, problems swallowing, or blurry vision. RESPIRATORY: Does have history of chronic obstructive pulmonary disease. States that she has inhalers for this. She was recently treated with antibiotics and prednisone for a flare up, by her primary doctor, 2 to 3 weeks ago, and is feeling better. CARDIAC: Appreciate cardiology consult. Denies current chest pain. History significant for edema, as noted above. No known arrhythmias, palpitations, or syncope. GI: As noted above. GENITOURINARY: No dysuria, hematuria, or other complaints. ENDOCRINE: Denies history of diabetes. No hot or cold intolerances. No history of hypothyroidism. INTEGUMENTARY: No erythema, lesion, or rash. MUSCULOSKELETAL: History positive for arthritis. No unusual arthralgias or myalgias. NEUROLOGIC: No history of CVA. No complaints of numbness, fainting, dizziness, or seizures. PSYCHIATRIC: No complaints of depression or  anxiety.  LABS/STUDIES:  Most recent lab work: Glucose 90, BUN 20, creatinine 1.21, serum sodium 142, potassium 3.4, chloride 105, GFR 48, calcium 8.1, LDH 250, total protein 6, albumin 2.6, total bilirubin 0.8, ALT 97, AST 74, and ALT 40. Cardiac enzyme work-up is  now normal. PT 15.1, INR 1.2. Fibrinogen 367. Reticulocyte cell count 3.1. Iron 110. Folic acid 11.2. TIBC 305. Iron saturation 36%. B12 normal. Hepatitis B surface antigen negative Coombs test negative. WBC 2.6, hemoglobin 9.7, hematocrit 28.9, platelet count 114, and normocytic red cells with increased RDW.  Stool culture was negative but did come back positive for C. difficile on 03/15/2012 and was treated.   Abdominal ultrasound of the liver demonstrated a heterogenous liver with lobulated margins and an enlarged spleen. There was no hepatomegaly, hepatic mass, dilated duct, or ascites noted.   Did have an echocardiogram this visit with normal ventricular function. Mild mitral and tricuspid regurgitation.   PHYSICAL EXAMINATION:   VITAL SIGNS: Most recent vital signs: Temperature 98.6, pulse 71, respiratory rate 20, blood pressure 109/57, and SaO2 on room air 93%.   GENERAL: Ambulatory in room, appears comfortable, no acute distress, pleasant.   PSYCH: Mood is stable, thought logical, cooperative.   HEENT: Normocephalic, atraumatic. Sclerae are without icterus. Eyes and nares without redness, drainage, or inflammation. Oral mucosa is pink and moist.   NECK: Supple. No JVD, lymphadenopathy, or thyromegaly.   RESPIRATORY: Faint mild expiratory wheezing throughout all lung fields. No retractions. No increased work of breathing. Respirations even and unlabored.   CARDIAC: S1 and S2. Regular rate and rhythm. No MRG. Pedal edema 2+ bilaterally.   ABDOMEN: Obese, active bowel sounds x4. Soft, nontender, and nondistended. No hepatosplenomegaly, masses, hernias, peritoneal signs, rebound tenderness, or guarding.   RECTAL: Deferred.    GENITOURINARY: Deferred.   EXTREMITIES: No clubbing or cyanosis. MAEW x4. Sensation appears intact. Gait steady. 2+ pedal edema as noted.   NEUROLOGIC: Cranial nerves II through XII grossly intact. Alert and oriented x3. Speech clear. No facial droop.   SKIN: No erythema or rash. Does have some scattered ecchymosis.   IMPRESSION AND PLAN:  1. C. difficile: Feeling better, treated with Flagyl. No further recommendations at this point.  2. Liver disease: With her description of what sounds like tense ascites, splenomegaly and abnormal appearance of liver on ultrasound, concern is for cirrhosis and has risk factors for hepatitis C. Other causes could be NAFLD versus less likely inherited or autoimmune disorder. No hepatic masses were found on imaging. 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 We will order hep B antibody and carbohydrate deficient transferrin level. I have advised 2 gram sodium diet and recommend continue with diuretics. We will follow liver labs done here and recommend further liver work-up as an outpatient.   These services were provided by Vevelyn Pathristiane Dreydon Cardenas, MSN, NP-C in collaboration with Barnetta ChapelMartin Skulskie, MD.  ____________________________ Keturah Barrehristiane H. Lida Berkery, NP chl:slb D: 03/18/2012 09:18:00 ET T: 03/18/2012 09:50:48 ET JOB#: [id number]  cc: Keturah Barrehristiane H. Serenah Mill, NP, <Dictator> Eustaquio MaizeHRISTIANE H Atif Chapple FNP ELECTRONICALLY SIGNED 03/18/2012 10:36

## 2015-04-06 NOTE — Consult Note (Signed)
HEMATOLOGY followup note - still weak, overall feels better. No fever or chills.no bleeding Sxs. Eating fairalert and oriented, NAD.          vitals - afebrile, stable          abd - soft, obsese, NT          ext - edemaWBC 2.6, Hb 9.7, plts 114, ANC 1.3, Cr 99.41.  64 year old female patient with history of chronic obstructive pulmonary disease, hypertension, arthritis and gastroesophageal reflux disease who has been admitted with acute coronary syndrome, possible CHF, C.diff colitis, Pancytopenia with more prominent leukopenia of 2500, mild anemia and mild thrombocytopenia  - workup is unremarkable so far except for Ultrasound showing splenemegaly and liver with heterogenous echotexture. Patient likely has underlying liver disease and splenomegaly, will get GI opinion. Otherwise she will need further evaluation to r/o other hematological disorders. Otherwise platelet count is improving, ANC is adequate and Hb is stable. No immediate treatment is needed for mild pancytopenia at this time. Will continue to follow and make further decision once GI eval is available including decide if she would need bone marrow biopsy evaluation. Patient explained above, agreeable to this plan.     Electronic Signatures: Jonn Shingles (MD)  (Signed on 05-Apr-13 23:14)  Authored  Last Updated: 05-Apr-13 23:14 by Jonn Shingles (MD)

## 2015-04-06 NOTE — H&P (Signed)
PATIENT NAME:  Helen Carlson, SZUCH MR#:  [id number] DATE OF BIRTH:  1951-03-20  DATE OF ADMISSION:  03/14/2012  PATIENT NAME:  Helen Carlson, [name] MR#:  [id number] DATE OF BIRTH:  Jul 24, 1951  DATE OF ADMISSION:  03/14/2012  CHIEF COMPLAINT: Leg swelling.   HISTORY OF PRESENT ILLNESS: This is a 64 year old pleasant female with a past medical history of hypertension and chronic obstructive pulmonary disease who comes in with one week's worth of leg swelling. The patient states that she has done nothing out of the ordinary in her usual routine with no increase in salty food and no change in medications. The patient states she has had an increase in shortness of breath out of baseline from her usual that is associated with chronic obstructive pulmonary disease. The patient also states that she had an episode of chest pain two to three days ago that she describes as coming and going and increased with movement especially while walking her dog. The patient states that while at rest she does not have any chest pain. Otherwise, she states she is compliant with her medications and because of the worsening leg swelling she came into the Emergency Room. She describes the chest pain as left-sided but cannot characterize the quality. She states that it does not radiate to her jaw or arm and is centrally located to the left side of her chest. Currently while here in the Emergency Room she does not complain of any chest pain.   PAST MEDICAL HISTORY: 1. Gastroesophageal reflux disease. 2. Chronic obstructive pulmonary disease. 3. Hypertension. 4. Anxiety. 5. Arthritis.  PAST SURGICAL HISTORY: None.  ALLERGIES: Penicillin.  SOCIAL HISTORY: She is a single female with two children both grown, currently unemployed. Currently still smoking cigarettes about 1 to 2 daily. Denies any illicit drugs.   FAMILY HISTORY: The patient states she has no family history, although on previous admission records is significant for blood clots.    REVIEW OF SYSTEMS: CONSTITUTIONAL: She does not report any fever or chills. EYES: No blurred vision. ENT: No hearing loss. CARDIOVASCULAR: She does report associated chest pain located to the left chest wall area, nonradiating, comes and goes, worse with exertion. PULMONARY: Has associated shortness of breath and wheezing. GI: No nausea, vomiting, or diarrhea. GU: No dysuria. ENDOCRINE: No heat or cold intolerance. INTEGUMENTARY: No rash. MUSCULOSKELETAL: Positive for arthralgia to joints. NEUROLOGIC: No numbness or weakness.   PHYSICAL EXAMINATION:  VITAL SIGNS: Blood pressure 143/67, respirations 22, pulse rate 95, temperature 97.6.   GENERAL: No apparent distress, responsive, pleasant.   HEENT: Pupils are equal, round, and reactive to light. Sclerae no icterus appreciated. Oral mucosa is moist.   NECK: Supple. No JVD appreciated. No lymphadenopathy or thyromegaly.  LUNGS: She has mild expiratory wheezing throughout both lung fields but no rales or rhonchi or crackles appreciated.   HEART: Regular rate and rhythm. S1 and S2 appreciated with no murmurs, rubs, or gallops noted.  ABDOMEN: Obese, soft, nontender, nondistended. Bowel sounds positive. No organomegaly appreciated.   EXTREMITIES: She has 2+ lower extremity edema noted.   NEUROLOGIC: Cranial nerves II through XII are intact. She is alert and oriented x4.   SKIN: She had a mild dermatitis noted on bilateral anterior lower extremities.   LABS AND STUDIES: Chest x-Deiss on my read does not show any large effusion noted or appreciable infiltrates. Troponin elevated at 0.06. WBC 2.5, 11.2, hematocrit 33, platelets 142, sodium 146, potassium 3.6, chloride 111, bicarb 25, BUN 20, creatinine 1.18, glucose 148,  calcium 8.4, AST slightly elevated at 95, ALT 47, albumin 2.9. B-type Natriuretic Peptide 338.   ASSESSMENT AND PLAN:  1. Acute coronary syndrome with noted elevated troponins while here in the Emergency Room. Will initiate  Lovenox q.12. Admit to telemetry. Will request Cardiology consultation. 2. Bilateral lower extremity edema with elevation of BNP while in the Emergency Room, likely CHF. Will discontinue amlodipine and initiate IV Lasix 40 b.i.d. with I's and O's to be monitored. Will order an echocardiogram for further evaluation and request for Cardiology consultation as well. 3. Chronic obstructive pulmonary disease. Will initiate nebulizer treatments q.6 hours for now due to noted wheezing on physical examination with outpatient meds Singulair and Advair to be held for now.  4. Gastroesophageal reflux disease. GI prophylaxis initiated.  5. Hypertension. Have discontinued amlodipine for now and is on Lasix 40 b.i.d. with carvedilol initiated. Blood pressure to be monitored for addition and/or titration as blood pressure can tolerate.  6. History of anxiety. Is on Xanax 0.25 mg p.o. b.i.d. p.r.n. as outpatient and will hold for now but may consider if warranted while inpatient.  7. Noted elevation of AST on liver function tests. Will repeat liver function tests with the next lab draw and send for Hepatitis C with the next lab draw as well.  ____________________________ Lazaro ArmsMichael C. Braylon Grenda, MD mcv:drc D: 03/14/2012 01:52:24 ET T: 03/14/2012 08:11:22 ET JOB#: [id number]  cc: Lazaro ArmsMichael C. Leomia Blake, MD, <Dictator>  Lazaro ArmsMICHAEL C Ginna Schuur MD ELECTRONICALLY SIGNED 04/13/2012 21:56

## 2015-04-06 NOTE — Consult Note (Signed)
Chief Complaint:   Subjective/Chief Complaint Please see full GI consult.  Patient admitted withACS, COPD, CHF, htn, noted to have slightly elevated ast on admission.  HBSAg negaitve, HCV RNAxPCR ordered by pmd.  Noted on us to have heterogenous echotexture with splenomegally.  No focal lesions though has a lobulated border of the liver.  Liver history reflects risk behavior remote with a partner that had hepatitis of unknown type in the setting of ivda.  Patietn also with history of tatoo.  Currently the ast is improving and , of note, a very non-specific finding.  Recommend further evaluation as o/p in fu with GI.   Electronic Signatures: Barnetta ChapelSkulskie, Rheta Hemmelgarn (MD)  (Signed Blase Mess05-Apr-13 21:44)  Authored: Chief Complaint   Last Updated: 05-Apr-13 21:44 by Barnetta ChapelSkulskie, Cameran Ahmed (MD)

## 2015-04-06 NOTE — Consult Note (Signed)
PATIENT NAME:  Helen Carlson, Helen Carlson MR#:  [id number] DATE OF BIRTH:  [birth year]  DATE OF CONSULTATION:  03/14/2012  REFERRING PHYSICIAN:  Dr. Max Sane CONSULTING PHYSICIAN:  Elam Ellis R. Ma Hillock, MD  REASON FOR CONSULTATION: Pancytopenia.   HISTORY OF PRESENT ILLNESS: Patient is a 64 year old female with past medical history significant for hypertension, chronic obstructive pulmonary disease, gastroesophageal reflux disease, anxiety, arthritis who has been admitted to hospital with progressive dyspnea on exertion worsening from her baseline symptoms along with progressive leg swelling. Patient also had some left-sided chest pain and was evaluated in ER and admitted to hospital earlier today. Currently she is resting, denies any chest pain or shortness of breath at rest but feels weak and tired. CBC done yesterday afternoon showed WBC low at 2500, hemoglobin 11.2, platelets 142, MCV normal at 89. Hematology has been consulted for low blood counts. Patient denies any known history of low blood counts in the past. She denies any known history of chronic liver disease, hepatitis or splenomegaly. She denies any major bleeding symptoms, has had easy skin bruising for a while. She denies any fevers or recurrent infections. Only prior CBC available on computer record shows that in May 2011 WBC was 6500, hemoglobin 12.9, platelets 177 with unremarkable differential.   PAST MEDICAL HISTORY/PAST SURGICAL HISTORY: As in history of present illness above.   FAMILY HISTORY: Denies any history of malignancy or hematological disorders.   SOCIAL HISTORY: Chronic smoker 1 to 2 daily, denies alcohol intake or recreational drug usage.   ALLERGIES: Penicillin.   HOME MEDICATIONS:  1. Advair Diskus 1 puff inhaler b.i.d.  2. DuoNeb SVN every six hours p.r.n.  3. Hypertension medication.  4. Meloxicam 1 tablet daily.  5. Singulair 1 tablet daily. 6. Spiriva inhaler once daily.   REVIEW OF SYSTEMS: CONSTITUTIONAL: As in  history of present illness. No fever or chills. HEENT: Currently denies any headaches, dizziness, epistaxis, ear or jaw pain. CARDIAC: As in history of present illness. Denies orthopnea or paroxysmal nocturnal dyspnea. LUNGS: Cough, dyspnea on exertion and sputum. No hemoptysis. GASTROINTESTINAL: No nausea, vomiting, or diarrhea. No bright red blood in stools or melena. GENITOURINARY: No dysuria or hematuria. MUSCULOSKELETAL: Has chronic arthritis. Denies any new bone pains. ENDOCRINE: No polyuria, polydipsia. Appetite is steady. Denies unintentional weight loss. NEUROLOGIC: No new focal weakness, seizures, or loss of consciousness. SKIN: No new rashes or pruritus. Has easy bruising.   PHYSICAL EXAMINATION:  GENERAL: Patient is weak and tired looking, resting in bed, otherwise alert and oriented, no acute distress. No icterus or pallor.   VITAL SIGNS: Temperature 98, pulse 81, respiratory rate 20, blood pressure 142/77, 92% on room air.   HEENT: Normocephalic, atraumatic. Extraocular movements intact. Sclera anicteric. No oral thrush.   NECK: Negative for lymphadenopathy.   CARDIOVASCULAR: S1, S2, regular rate and rhythm.   LUNGS: Lungs show decreased breath sounds overall, more so at bases. No rhonchi or crepitations.   ABDOMEN: Soft, obese, nontender. No hepatosplenomegaly clinically.   EXTREMITIES: 2+ edema. No cyanosis.   NEUROLOGIC: Cranial nerves intact. Moves all extremities spontaneously.   SKIN: Mild bruising.   LYMPHATICS: No adenopathy in the axillary or inguinal areas.   LABORATORY, DIAGNOSTIC, AND RADIOLOGICAL DATA: BNP 338, troponin I elevated at 0.06, WBC 2500, hemoglobin 11.2, platelets 142, MCV 89, creatinine 1.18. Liver functions show normal bilirubin. Liver enzymes unremarkable except AST of 95, albumin low at 2.9.   Chest x-Riedinger reported no acute cardiopulmonary disease.   IMPRESSION AND RECOMMENDATIONS: 64 year old female patient  with history of chronic obstructive  pulmonary disease, hypertension, arthritis and gastroesophageal reflux disease who has been admitted with progressive dyspnea on exertion along with increasing leg swelling, has elevated cardiac enzymes and BNP. CBC shows pancytopenia with more prominent leukopenia of 2500, mild anemia and mild thrombocytopenia with no obvious major bleeding symptoms. Will have to presume that low blood counts is a new finding, only CBC to compare with is from May 2011 when CBC was normal. Patient, therefore, will need further evaluation to try to find etiology of pancytopenia. Accordingly, will get CBC with manual differential and reticulocyte count to see if she has hypoproliferative marrow, B12 and folate level, iron study including iron and TIBC, folic acid, LDH/haptoglobin/direct Coombs test to look for evidence of hemolysis, PT/PTT/fibrinogen to look for evidence of DIC or coagulopathy, serum protein immunofixation and random urine immunofixation electrophoresis to look for plasma cell dyscrasia and monoclonal paraprotein, HBsAg (HCV antibody is pending.) Will also get ultrasound of the abdomen to evaluate for any obvious cirrhosis or hepatosplenomegaly. No immediate treatment is needed for mild pancytopenia at this time. Will continue to follow and make further decision once results are available including decide if she would need bone marrow biopsy evaluation. Patient explained above, agreeable to this plan.   Thank you for the referral. Please feel free to contact me if additional questions.  ____________________________ Rhett Bannister Ma Hillock, MD srp:cms D: 03/15/2012 00:00:13 ET T: 03/15/2012 05:46:03 ET JOB#: [id number]  cc: Virgie Chery R. Ma Hillock, MD, <Dictator> Alveta Heimlich MD ELECTRONICALLY SIGNED 03/15/2012 8:16

## 2015-04-06 NOTE — Consult Note (Signed)
Brief Consult Note: Diagnosis: Edema, chest pain.   Patient was seen by consultant.   Consult note dictated.   Comments: Appreciate consult for 64 y/o caucasian woman with history of COPD and GERD for anemia and liver disease. Noted and appreciate hematology consult. Also noted pending Hepatitis C RNA PCR. HBsAG negative. Noted that patient also had experiencd NVD/c-diff infection (diagnosed 4/3 and is on Flagyl): state she had antobiotics for a respiratory infection a few weeks ago. States her abdominal pain, NVD is now gone. Has had a mild elevation of AST, and heterogenous appearing liver with lobulated margins on limited US. Splenomegaly is also present. Patient describes general body swelling to include her abdomen  just prior to admission, states that her abdomen was tight and hard- that she appeared pregnant, and had lower leg edema. States she gained about 20lb in what felt like overnight. No history of known liver disease, military service, health care work, blood transfusion, IVDU, intranasal cocaine, incarceration. Does have a tattoo and was born between [id number].  States very rare etoh consumption. Denies DM, never had lipids tested. Denies family history of liver disease. No episodes of jaundice, confusion. Denies all other GI complaints- states her current PPI controls it well. Feeling better today. No history of luminal evaluation.  Impression: Liver disease: with her description of what sounds like tense ascites, splenomegaly, and abnormal appearance of liver on US, concern exists for cirrhosis: has risk factors for Hepatitis C. Other causes could be NAFLD vs. less likely inherited/autoimmune disorder. No hepatic masses found on imaging. Does appear to have alcoholic liver pattern, although states she rarely has etoh. Will order Hep B antibody and carbohydrate deficient transferrin. Have advised a 2gm Na diet and recommend continuing with diuretics. Will follow liver labs done here and  recommend further liver work up as outpatient.  Electronic Signatures: Vevelyn PatLondon, Deadrian Toya H (NP)  (Signed Blase Mess05-Apr-13 20:07)  Authored: Brief Consult Note   Last Updated: 05-Apr-13 20:07 by Keturah BarreLondon, Nyko Gell H (NP)

## 2015-04-06 NOTE — Consult Note (Signed)
PATIENT NAME:  Helen Carlson, [name] MR#:  [id number] DATE OF BIRTH:  10/26/1951  DATE OF CONSULTATION:  03/14/2012  REFERRING PHYSICIAN:  Dr. Deneen HartsValdez  CONSULTING PHYSICIAN:  Marcina MillardAlexander Annaclaire Walsworth, MD  CHIEF COMPLAINT: "I am swollen up."   HISTORY OF PRESENT ILLNESS: Patient is a 64 year old female referred for evaluation of borderline elevated troponin. Patient has a history of chronic obstructive pulmonary disease and hypertension. She reports a two-week history of increasing pedal edema. She has had intermittent chest discomfort and chronic exertional dyspnea. She [facility] where she was noted to have bilateral pedal edema. Admission labs were notable for initial troponin of 0.04 with a follow-up troponin of 0.06. CPK total and MB were normal. EKG was nondiagnostic. Patient is being treated with intravenous Lasix with diuresis and clinical improvement.   PAST MEDICAL HISTORY:  1. Chronic obstructive pulmonary disease.  2. Hypertension.  3. Gastroesophageal reflux disease   MEDICATIONS ON ADMISSION:  1. Advair Diskus 250/50, 1 puff b.i.d.  2. DuoNeb 2.5 /0.5 q.6 p.r.n.  3. Meloxicam daily.  4. Singulair daily.  5. Spiriva inhaler daily.   SOCIAL HISTORY: Patient currently lives alone. She reports that she smokes less than a pack of cigarettes a day.   FAMILY HISTORY: No immediate family history of coronary artery disease or myocardial infarction.   REVIEW OF SYSTEMS: CONSTITUTIONAL: Patient reports she has had some mild fever and chills. EYES: No blurry vision. EARS: No hearing loss. RESPIRATORY: Shortness of breath due to underlying chronic obstructive pulmonary disease. CARDIOVASCULAR: Patient has intermittent mild chest discomfort. GASTROINTESTINAL: Patient denies nausea, vomiting, diarrhea, or constipation. GENITOURINARY: Patient denies dysuria, hematuria. ENDOCRINE: Patient denies polyuria or polydipsia. MUSCULOSKELETAL: Patient denies arthralgias, myalgias. NEUROLOGICAL:  Patient denies focal muscle weakness or numbness. PSYCHOLOGICAL: Patient denies depression or anxiety.   PHYSICAL EXAMINATION:  VITAL SIGNS: Blood pressure 146/74, pulse 70, respirations 20, temperature 98.2, pulse oximetry 98%.   HEENT: Pupils equal, reactive to light and accommodation.   NECK: Supple without thyromegaly.   LUNGS: Clear.   CARDIOVASCULAR: Normal jugular venous pressure. Normal point of maximal impulse. Regular rate and rhythm. Normal S1, S2. No appreciable gallop, murmur, or rub.   ABDOMEN: Soft, nontender without hepatosplenomegaly.   EXTREMITIES: There is 2+ bilateral pedal edema extending up to below both knees.   MUSCULOSKELETAL: Normal muscle tone.   NEUROLOGIC: Patient is alert and oriented x3. Motor and sensory are both grossly intact.   IMPRESSION: 64 year old female with chronic obstructive pulmonary disease, probable venous insufficiency who presents with fluid retention and pedal edema. Patient had borderline elevated troponin which is likely due to demand supply ischemia and unlikely due to acute coronary syndrome. Echocardiogram reveals normal left ventricular function with mild concentric left ventricular hypertrophy which again would be consistent with diastolic dysfunction.   RECOMMENDATIONS:  1. Agree with overall current therapy.  2. Would defer full dose anticoagulation.  3. Continue diuresis, follow BUN and creatinine closely.  4. Counseled the patient about the importance of low sodium diet.  5. Strongly encouraged patient to stop smoking.  6. Would defer noninvasive or invasive cardiac evaluation at this time. May consider outpatient functional study once patient is euvolemic.  ____________________________ Marcina MillardAlexander Kashay Cavenaugh, MD ap:cms D: 03/14/2012 18:02:44 ET T: 03/15/2012 09:41:50 ET JOB#: [id number]  cc: Marcina MillardAlexander Tonio Seider, MD, <Dictator> Marcina MillardALEXANDER Oceana Walthall MD ELECTRONICALLY SIGNED 03/29/2012 8:46

## 2015-04-13 NOTE — H&P (Signed)
PATIENT NAME:  Helen Carlson, Helen Carlson MR#:  [id number] DATE OF BIRTH:  1950/12/21  DATE OF ADMISSION:  12/19/2014  PRIMARY CARE PHYSICIAN: Dr. Beverely Risen.   CHIEF COMPLAINT: Shortness of breath, cough.   HISTORY OF PRESENT ILLNESS: The patient is a 64 year old Caucasian female who was sent over to the hospital as a direct admission from Dr. Dalphine Handing office for progressively worsening of shortness of breath and cough. The patient is reporting that for the past 2-3 days, she has been sick, started with cold symptoms. Gradually, her shortness of breath and cough has been getting worse. She used outpatient antibiotic, doxycycline, and levofloxacin, with no significant improvement. The patient was seen by her primary care physician, Dr. Beverely Risen, today again, regarding worsening of shortness of breath and cough. She feels cold chills, but denies any fever. The patient was sent over to the hospital as a direct admission. During my examination, the patient is some complaining of tightness in her chest and wheezing. She has been using inhibitors and nebulizer treatments at home with no significant improvement. Chest x-Carnero has revealed no active cardiopulmonary disease. Her initial pulse ox here is at 92% on 2 liters with exertion, and 93% on 2 liters. The patient reports that she uses 2 liters of oxygen at home. Had a feeling of some chest pain when she coughs, intermittently. Denies any dizziness or loss of consciousness.   PAST MEDICAL HISTORY: History of COPD, congestive heart failure, anxiety, depression, hyperlipidemia, hypertension, hepatitis C. She follows up with gastroenterology regarding hepatitis C. Chronic atrial fibrillation, probably paroxysmal.  PAST SURGICAL HISTORY: None.   ALLERGIES: THE PATIENT IS ALLERGIC TO PENICILLIN.   PSYCHOSOCIAL HISTORY: Lives at home with her son. She used to smoke, but quit smoking several years ago. No history of alcohol or illicit drug usage.   FAMILY HISTORY:  Hypertension runs in her family.   REVIEW OF SYSTEMS:  CONSTITUTIONAL: Denies any fever. Complaining of fatigue and weakness.  EYES: Denies blurry vision, double vision.  EARS, NOSE, AND THROAT: Denies epistaxis or discharge, but complaining of nasal congestion.  RESPIRATORY: Complaining of cough, shortness of breath, wheezing. Has history of COPD.  CARDIOVASCULAR: Intermittent episodes of chest pain while coughing. Otherwise no palpitations.  GASTROINTESTINAL: Denies nausea, vomiting, diarrhea, abdominal pain.  GENITOURINARY: No dysuria or hematuria.  ENDOCRINE: Denies polyuria, nocturia, or thyroid problems.  HEMATOLOGIC AND LYMPHATIC: No anemia or easy bruising, but has thrombocytopenia. No bleeding.  INTEGUMENTARY: No acne, rash, lesions.  MUSCULOSKELETAL: No joint pain in the neck and back.  NEUROLOGIC: Denies vertigo, ataxia. No history of TIA or CVA.  PSYCHIATRIC: No ADD or OCD. Has history of anxiety.   HOME MEDICATION LIST: Advair 250/50 one puff inhalation 2 times a day, Coreg 3.125 mg p.o. 2 times a day, diltiazem 120 mg p.o. once daily, Lasix 40 mg p.o. 2 times a day, prednisone tapering dose given by her primary care physician recently, Singulair 10 mg once daily, tiotropium 18 micrograms inhalation capsule once daily, Xanax 0.25 mg 1 tablet p.o. b.i.d. as needed, erythromycin 250 mg 1 tablet p.o. once daily, which was recently given by her primary care physician.   PHYSICAL EXAMINATION:  VITAL SIGNS:  Temperature 98.3, pulse 88, respirations 18,blood pressure 147/52, pulse ox  93% on 2 liters.  GENERAL APPEARANCE: Not in acute distress. Moderately built and obese.  HEENT: Normocephalic, atraumatic. Pupils are equally reacting to light and accommodation. No scleral icterus. No conjunctival injection. No sinus tenderness. No postnasal drip. Moist mucous  membranes. NECK: Supple. No JVD. No thyromegaly. Range of motion is intact.   LUNGS: Coarse bronchial breath sounds with diffuse  wheezing. No crackles. No rales or rhonchi. CARDIOVASCULAR: Irregularly irregular. No murmurs.  GASTROINTESTINAL: Soft. Bowel sounds are positive in all 4 quadrants. Nontender, nondistended.  Some ascites is present  NEUROLOGICAL: Awake, alert, and oriented x 3. Cranial nerves II through XII are grossly intact. Motor and sensory are intact. Following verbal commands.  EXTREMITIES: 2 to 3+ pitting edema is present. No cyanosis. No clubbing.  SKIN: Warm to touch. Normal turgor. No rashes. No lesions.  LABORATORY DATA AND IMAGING STUDIES: Chest x-Anger PA and lateral views: No active cardiopulmonary disease. Mild hyperinflation again noted. A 12-lead EKG: Is ordered, which is pending at this time.   WBC 5.6, hemoglobin 12.8, hematocrit 39.3, platelets are 121. Glucose 108, BUN, creatinine, and sodium are normal. Potassium 3.0, chloride is 98, CO2 of 37, anion gap is 5, serum osmolality 229, calcium 8.4. Total protein 5.9, albumin 2.3, bilirubin total is 1.1. Alkaline phosphatase 161  AST 45, ALT 39.   ASSESSMENT AND PLAN: A 64 year old Caucasian female sent over to the hospital as a direct admission from primary care physician's office for worsening of shortness of breath and cough. The patient also reported approximately 30 pounds of weight gain with worsening of lower extremity edema. 1. Acute respiratory distress secondary to acute exacerbation of chronic obstructive pulmonary disease and acute bronchitis. Will admit her to med/surg unit. Monitor her on off unit telemetry. I will provide her Solu-Medrol 120 mg IV x 1, and we will continue Solu-Medrol 60 mg IV q. 6 hours. We will provide her DuoNebs and albuterol nebulizer treatments. We will continue Spiriva. We will provide her Flonase for nasal congestion. We will provide her ciprofloxacin for her underlying bronchitis. The patient reported that she used doxycycline and levofloxacin, but according to the reports or records, it seems like she was given  doxycycline and a Z-Pak.  2. Congestive heart failure. According to the previous echocardiogram in March 2015, normal ejection fraction, but the patient has significant lower extremity edema. We will provide her Lasix and monitor daily weights.  3. Has a chronic history of atrial fibrillation, rate controlled at this time, not on any anticoagulants. According to Dr. America BrownFath's note from previous admission, her coagulation was deferred to address as an outpatient, but I do not see any anticoagulants on her medication list. I will currently put her on aspirin, and the patient needs to follow up with cardiology regarding the anticoagulation.  4. Hypokalemia. We will replace.  5. History of obstructive sleep apnea. We will provide her CPAP at bedtime.  6. Hypertension. We will resume her home medications.  7. Hyperlipidemia. Continue statin.  8. Chronic history of hepatitis C. The patient is to follow up with gastroenterology as an outpatient as recommended.   CODE STATUS: She is full code. Sister is the medical power of attorney.   Diagnosis and plan of care were discussed in detail with the patient, and she verbalized understanding of the plan.   TOTAL TIME SPENT: 50 minutes.    ____________________________ Ramonita LabAruna Corney Knighton, MD ag:mw D: 12/19/2014 15:23:34 ET T: 12/19/2014 16:05:13 ET JOB#: [id number]  cc: Ramonita LabAruna Lorra Freeman, MD, <Dictator> Ramonita LabARUNA Aanyah Loa MD ELECTRONICALLY SIGNED 12/28/2014 15:24

## 2015-04-13 NOTE — Discharge Summary (Signed)
PATIENT NAME:  Helen Carlson, Helen Carlson MR#:  [id number] DATE OF BIRTH:  August 06, 1951  DATE OF ADMISSION:  12/27/2014 DATE OF DISCHARGE:  12/27/2014  DISPOSITION: Discharged to [facility] on January 15.   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   OTHER DIAGNOSES: Include: 1. Chronic obstructive pulmonary disease.  2. Hypertension.  3. Atrial fibrillation.     REASON FOR TRANSFER: Complicated CAD.   HOSPITAL COURSE: The patient is a 64 year old female patient with history of hepatitis C, COPD, atrial fibrillation, who follows up with Dr. Adrian BlackwaterShaukat Khan, came because of mid epigastric pain, and the patient also has shortness of breath. The patient's troponins were elevated to 40 and admitted to telemetry for non-ST-elevation MI. The patient was started on a heparin drip, and nitroglycerin and morphine for chest pain. The patient was taken to cardiac catheterization by Dr. Milta DeitersS. Khan. She was admitted last week. The patient did have a cardiac cath on January 8th when she came for chest pain. At that time, cardiac cath showed a normal EF and no significant CAD with normal EF. The patient does have 40% circumflex stenosis, but RCA was normal at that time. The patient was discharged home on the 12th. The patient had COPD exacerbation and also rapid atrial fibrillation at that time. The patient was admitted on January 7th and discharged on January 12th. The patient was discharged with Cardizem and amiodarone for atrial fibrillation. She did get some nebulizers for her COPD. Today, she came because of epigastric pain and non-ST-elevation MI. The patient was thought to have a thrombus that might have dislodged to RCA and causing RCA occlusion. The patient's cardiac cath today showed RCA 100% occlusion. The patient's EF 52%. The patient feels better, pain is better, shortness of breath is better. Troponins usually is 40. The patient's CBC was normal, and white count was up  initially at 15.2(likley due to  recent use of steroids. [facility]  [facility] treatment with an RCA occlusion treatment. The patient's other diagnoses include chronic obstructive pulmonary disease and chronic atrial fibrillation.   DISCHARGE MEDICATIONS: Heparin drip normal saline at 100 mL an hour, Xanax 0.5 mg every 6 hours Carlson.r.n. for anxiety, Cardizem CD 120 mg Carlson.o. daily, amiodarone 800 mg Carlson.o. daily. The patient got metoprolol 25 mg Carlson.o. b.i.d., Zofran 4 mg IV q. 4 hours for nausea and vomiting. The patient is also on nebulizers every 6 hours, DuoNeb.  DISCHARGE VITAL SIGNS: Temperature is 98.2, heart rate 103, blood pressure 111/81 saturations  96% on 2 liters.   [facility] Dr. Jaquita FoldsShukat khan has arrnaged the transfer.  TIME SPENT: More than 30 minutes.    ____________________________ Katha HammingSnehalatha Winry Egnew, MD sk:mw D: 12/27/2014 15:39:07 ET T: 12/27/2014 16:08:23 ET JOB#: [id number]  cc: Katha HammingSnehalatha Felix Meras, MD, <Dictator> Laurier NancyShaukat A. Khan, MD Katha HammingSNEHALATHA Betania Dizon MD ELECTRONICALLY SIGNED 01/14/2015 13:08

## 2015-04-13 NOTE — Consult Note (Signed)
PATIENT NAME:  Helen Carlson, Helen Carlson MR#:  [id number] DATE OF BIRTH:  December 27, 1950  DATE OF CONSULTATION:  12/20/2014  REFERRING PHYSICIAN:   CONSULTING PHYSICIAN:  Alinda SierrasEileen A. Rami Waddle, PA-C  INDICATION FOR CONSULTATION: Atrial fibrillation and CHF and chest pain.   HISTORY OF PRESENT ILLNESS: This is a 64 year old Caucasian, pleasant female who presented to the hospital through direct admit from Dr. Beverely RisenFozia Khan for increased shortness of breath, cough, fatigue and weakness. She states edema, shortness of breath, cough, and 50 pound weight gain have occurred over the last month. She admits to concurrent PND, orthopnea, chest pain with radiation to left jaw and palpitations.   PAST MEDICAL HISTORY: COPD, hypertension, hyperlipidemia, chronic atrial fibrillation, CHF and hepatitis C.   FAMILY HISTORY: Maternal history of hypertension, COPD and MI at age 64.   ALLERGIES: No known drug allergies.   SOCIAL HISTORY: A 50 pack-year history, quit 3 weeks ago, using nicotine patch. No EtOH or other illicit drug use.   REVIEW OF SYSTEMS: GENERAL: Positive for fatigue and malaise.  RESPIRATORY: Positive for cough, shortness of breath with exertion, wheezing.  CARDIOVASCULAR: Positive for substernal pressure-type chest pain with radiation to left jaw, positive palpitations, PND and orthopnea. Also positive for 50 pound weight gain in the last month.   HOME MEDICATIONS: Advair 250/50 one inhalation b.i.d., Coreg 3.125 mg b.i.d., diltiazem 120 mg daily, Lasix 40 mg b.i.d., Singular 10 mg daily, tiotropium 18 mcg inhalation once daily, Xanax 0.25 mg b.i.d. Carlson.r.n.   PHYSICAL EXAMINATION: VITAL SIGNS: Temperature 97.9, pulse 107, respirations 20, blood pressure 126/87, pulse ox 96% saturation on room air.  GENERAL: A and O x3, in no acute distress. Nasal cannula oxygen in place. Obese.  HEENT: No JVD or carotid bruit.  LUNGS: Bilateral wheezes and rhonchi. No crackles appreciated.  CARDIOVASCULAR: Irregularly  irregular. No audible murmurs.  GASTROINTESTINAL: Soft, nontender. Positive bowel sounds.  EXTREMITIES: Trace to 1+ pedal edema bilaterally.   DIAGNOSTIC DATA: Creatinine 0.67. Troponin first set 0.03. TSH 0.591. WBC 2.9.   EKG shows atrial fibrillation, 99 ventricular rate, PVCs, poor R wave progression, nonspecific ST and T changes.  ASSESSMENT AND PLAN: 1.  Atrial fibrillation with rapid ventricular rate. Continue aspirin and Cardizem, will attempt to convert atrial fibrillation with 250 mg Tikosyn b.i.d. Plan for cardiac catheterization today.  2.  Congestive heart failure. Ejection fraction 75%. Grade 1 diastolic dysfunction and mild mitral regurgitation and mild tricuspid regurgitation. Advise continuation of Coreg and Lasix.   We will continue to follow. Thank you very much for this consult.   ____________________________ Alinda SierrasEileen A. Jarold Mottoomano, PA-C ear:sb D: 12/20/2014 09:01:00 ET T: 12/20/2014 09:15:12 ET JOB#: [id number]  cc: Marjean DonnaEileen A. Margarito Courseromano, PA-C, <Dictator> Alinda SierrasEILEEN A Jawad Wiacek PA ELECTRONICALLY SIGNED 12/26/2014 10:11

## 2015-04-13 NOTE — Discharge Summary (Signed)
PATIENT NAME:  Helen Carlson, SACHSE MR#:  [id number] DATE OF BIRTH:  1951/07/10  DATE OF ADMISSION:  12/19/2014 DATE OF DISCHARGE:  12/24/2014  PRIMARY CARE PHYSICIAN:  Yevonne Pax, MD.    FINAL DIAGNOSES:  1. Acute respiratory failure.  2. Chronic obstructive pulmonary disease exacerbation.  3. Chronic diastolic congestive heart failure with weight gain.  4. Essential hypertension.  5. Rapid atrial fibrillation.  6. Gastroesophageal reflux disease.   MEDICATIONS ON DISCHARGE: Include omeprazole 40 mg daily, diltiazem ER 120 mg daily; prednisone 10 mg 4 tablets day 1, three tablets day 2, two tablets day 3, one tablet days 4 and 5; oxycodone 5 mg every 6 hours as needed for moderate pain, amiodarone 200 mg 4 tablets daily, nystatin swish and swallow 5 mL every 6 hours for 10 days, nicotine patch 21 mg per chest wall daily, Lasix 40 mg twice a day, potassium chloride 20 mg twice a day, aspirin 81 mg daily, albuterol nebulizer 2.5 mg nebulizer every 4 hours as needed for wheezing, Advair HFA 115/21 two puffs twice a day, Spiriva 2.5 mcg/inhalation 2 puffs once a day. Stop taking Coreg and cyclobenzaprine.  Oxygen 2 liters nasal cannula wears at night.   DIET: Low sodium diet, regular consistency.   ACTIVITY: As tolerated.   FOLLOWUP:  With Laurier Nancy, MD on Thursday, 1 to 2 weeks with Yevonne Pax, MD.    HOSPITAL COURSE: The patient was admitted 12/19/2014 and discharged 12/24/2014. Came in with shortness of breath and cough, was admitted with acute respiratory distress requiring oxygen supplementation, high-dose Solu-Medrol and Solu-Medrol around-the-clock and was put on Cipro prophylactically. She was admitted with suspected congestive heart failure and weight gain and was started on IV Lasix.    CONSULTS DURING THE HOSPITAL COURSE: Included Laurier Nancy, MD, cardiology.   LABORATORY AND RADIOLOGICAL DATA DURING THE HOSPITAL COURSE: Included a chest x-Auletta showed no active disease,  hyperinflation.  Troponin was negative. Glucose 108, BUN 11, creatinine 0.80, sodium 140, potassium 3.0, chloride 98, CO2 of 37, calcium 8.4, alkaline phosphatase 161, ALT 39, AST 45. White blood cell count 5.6, hemoglobin and hematocrit 12.8 and 39.3, platelet count of 121,000. EKG showed atrial fibrillation, premature ventricular complexes. EKG showed an EF of 75%, elevated left atrial and left ventricular end diastolic pressures, restrictive pattern of left ventricular diastolic filling, severely dilated left atrium. Magnesium was 1.6. TSH 0.591. Cardiac catheterization negative. Creatinine upon discharge 0.92, potassium 3.8.   HOSPITAL COURSE PER PROBLEM LIST:  1. For the patient's acute respiratory failure, this has improved. The patient does not require home oxygen 24/7 with our qualifying data. She does wear oxygen at night.  2. COPD exacerbation.  The patient was given IV Solu-Medrol high-dose, switched over to a prednisone taper. Did have p.o. Cipro here.  Prescriptions for Spiriva, Advair and DuoNeb nebulizer solution were written.  3. Chronic diastolic congestive heart failure and weight gain. I do not think this was an acute exacerbation, but we treated it as such anyway with IV Lasix and then switched over to p.o. She has had a 50 pound weight gain recently so I still kept Lasix 40 mg p.o. twice a day upon discharge and gave potassium supplementation.  4. Atrial fibrillation. The patient went rapid here, was started on amiodarone drip, removed off the Cardizem and Coreg and then went back into atrial fibrillation on the day of discharge, rapid, and I needed to restart Cardizem. I gave 1 dose of 300, diltiazem ER  and sent home on 120 mg daily.   Dr. Welton FlakesKhan will follow up Thursday and likely decrease the amiodarone dose and potentially get rid of the diltiazem also.  5. Gastroesophageal reflux disease, on omeprazole.  6. Thrush, on nystatin.   TIME SPENT ON DISCHARGE: 35 minutes.   PHYSICAL  EXAMINATION ON DISCHARGE:  Please see note for the day.    ____________________________ Herschell Dimesichard J. Renae GlossWieting, MD rjw:by D: 12/24/2014 15:49:14 ET T: 12/24/2014 22:07:58 ET JOB#: [id number]  cc: Herschell Dimesichard J. Renae GlossWieting, MD, <Dictator> Yevonne PaxSaadat A. Khan, MD Laurier NancyShaukat A. Khan, MD  Salley ScarletICHARD J Ayesha Markwell MD ELECTRONICALLY SIGNED 12/25/2014 17:00

## 2015-04-13 NOTE — Consult Note (Signed)
Will correct K, and MG, and start then tykosyn and will do cath today. Also will need to move to telemetry.  Electronic Signatures: Radene KneeKhan, Orman Matsumura Ali (MD)  (Signed on 08-Jan-16 11:36)  Authored  Last Updated: 08-Jan-16 11:36 by Radene KneeKhan, Dimarco Minkin Ali (MD)

## 2015-04-13 NOTE — Consult Note (Signed)
PATIENT NAME:  Helen Carlson, Madelon P MR#:  [id number] DATE OF BIRTH:  Jan 14, 1951  DATE OF CONSULTATION:  12/27/2014  CONSULTING PHYSICIAN:  Laurier NancyShaukat A. Nicle Connole, MD  HISTORY OF PRESENT ILLNESS:  This is a 64 year old white female with a past medical history of COPD, who presented to the Emergency Room with severe shortness of breath. She was admitted to the hospital last week and she underwent cardiac catheterization last week which showed no significant coronary artery disease, normal left ventricular systolic function, but was in the hospital until this Tuesday because of COPD exacerbation.  She also had atrial fibrillation paroxysmally and had been treated with amiodarone after Tikosyn was not approved by Medicaid.  She was on 800 mg amiodarone when she was in the hospital and was tapered to 400 mg.  She left Tuesday and was seen Thursday in the morning in the office, where she complained of severe shortness of breath. EKG was done which showed atrial fibrillation with rapid ventricular response rate, no significant ST changes.  She was wheezing.  Thus, it was felt that the patient probably developed atrial fibrillation again and was added Cardizem on top of the amiodarone and amiodarone was increased to 800 a day.  She was also taking aspirin.  She was set up for a sleep study because it was felt the atrial fibrillation may be due to sleep apnea.  Last night she presented to the Emergency Room with epigastric pain associated with some shortness of breath and she was seen by the ER physician who called me and said that the patient had shortness of breath, essentially no chest pain, but he was about to discharge her because she felt it was COPD and troponin came back more than 40.  EKG was done which showed probable atrial fibrillation. There was no discernible P-waves, it was read as wide QRS complex with left axis deviation, and poor R-wave progression suggestive of anteroseptal wall myocardial infarction, but apparently  it looks more like, nonspecific intraventricular conduction delay with a heart rate of 109.  He was recommended to be admitted and started on Heparin, aspirin, and Aggrastat and continue amiodarone and diltiazem.  She at this time this morning she is denying any chest pain.   SOCIAL HISTORY: She denies EtOH abuse or smoking.   PAST MEDICAL HISTORY: Chronic obstructive pulmonary disease, hypertension, GI reflux, paroxysmal atrial fibrillation.   FAMILY HISTORY:  Mother had lymphoma. No coronary artery disease.   PHYSICAL EXAMINATION:  GENERAL: She is alert and oriented right now and not short of breath or having chest pain,   VITAL SIGNS: Blood pressure 118/81, respirations 16, pulse 103, temperature 98.2, saturation 96%  HEENT:  No JVD.  LUNGS: There is wheezing.  HEART: Tachy. Normal S1, S2. No audible murmur.  ABDOMEN: Soft, nontender, positive bowel sounds.  EXTREMITIES: No pedal edema.  NEUROLOGIC: She appears to be intact.   LABORATORY DATA:  Troponin is elevated to over 40 and CPK is 200, second set is also 40.   ASSESSMENT AND PLAN: Most likely this is in the setting of essentially no significant coronary artery disease last week, this cannot be a complication of cardiac catheterization with dissection because she was in the hospital for like 4 days after the catheterization without any chest pain or worsening of her symptoms.  She did have some wheezing for which she was treated.  In fact, on Thursday morning, she did not have chest pain. She was just short of breath and it was attributed  due to chronic obstructive pulmonary disease exacerbation for which she is being treated.  It is possible that she, due to atrial fibrillation, may have dislodged a thrombus and had gone to her coronaries which caused a non ST segment myocardial infarction. We will do an echocardiogram to evaluate her ejection fraction wall motion and to see if this might be myocarditis or perimyocarditis.  Also, we will  have to do a cardiac catheterization again.  In the meantime, continue aspirin, heparin and may add Aggrastat.     ____________________________ Laurier Nancy, MD sak:DT D: 12/27/2014 08:18:45 ET T: 12/27/2014 09:05:35 ET JOB#: [id number]  cc: Laurier Nancy, MD, <Dictator> Laurier Nancy MD ELECTRONICALLY SIGNED 01/11/2015 11:44

## 2015-04-13 NOTE — H&P (Signed)
PATIENT NAME:  Helen Carlson, Helen Carlson MR#:  [id number] DATE OF BIRTH:  1951-03-07  DATE OF ADMISSION:  12/27/2014  REFERRING PHYSICIAN:  Eartha Inch. York Cerise, MD  PRIMARY CARE PHYSICIAN:  Yevonne Pax, MD  ADMISSION DIAGNOSIS:  Non-ST elevation myocardial infarction.   HISTORY OF PRESENT ILLNESS:  This is a 64 year old Caucasian female who presents to the Emergency Department complaining of epigastric pain. The patient states the pain comes and goes but is frequently incredibly severe, albeit brief, in the middle of her chest directly inferior to her sternum. The patient denies any radiating pain into her chest or any associated shortness of breath. The patient admits to feeling nauseous and has had some episodes of diaphoresis. She recently was discharged from the hospital after an acute-on-chronic episode of respiratory failure as well as congestive heart failure. At the time of admission, the patient was 50 pounds over her dry weight. She has been on Lasix since discharge. When she saw her cardiologist today, she felt that she was completely worn out by her visit. Her overall feeling of unwellness is what brought her to the Emergency Department. She initially checked out to have no acute concerns, but a troponin drawn with her baseline Emergency Department labs incidentally showed an elevation at more than 40, which prompted the Emergency Department to call for admission.   REVIEW OF SYSTEMS: CONSTITUTIONAL:  The patient denies fever, but admits to general malaise.  EYES:  Denies blurred vision or inflammation.  EARS, NOSE, AND THROAT:  Denies tinnitus or sore throat.  RESPIRATORY:  Denies cough or shortness of breath.  CARDIOVASCULAR:  Denies chest pain or palpitations. She denies proximal nocturnal dyspnea, but admits to a history of orthopnea, although she is not experiencing this at this time.  GASTROINTESTINAL:  Admits to nausea, but denies vomiting. The patient also admits to mid epigastric pain.   GENITOURINARY:  Denies dysuria or increased frequency or hesitancy of urination.  ENDOCRINE:  Denies polyuria or polydipsia.  HEMATOLOGIC AND LYMPHATIC:  Denies easy bruising or bleeding.  INTEGUMENTARY:  Denies rashes or lesions.  MUSCULOSKELETAL:  Denies arthralgias or myalgias.  NEUROLOGIC:  Denies numbness in her extremities or dysarthria.  PSYCHIATRIC:  Denies depression or suicidal ideation.   PAST MEDICAL HISTORY:  COPD, hypertension, gastroesophageal reflux disease, as well as recent onset atrial fibrillation and possible congestive heart failure.   PAST SURGICAL HISTORY:  The patient recently underwent a heart catheterization, which showed a 75% ejection fraction and a 40% stenosis of her circumflex artery.   SOCIAL HISTORY:  The patient lives with her son and daughter-in-law. She does not smoke, drink, or do any drugs.   FAMILY HISTORY:  The patient's mother is deceased of lymphoma. She has a son who is living, but has cancer of some type in his foot.   MEDICATIONS: 1.  Advair 115/21 mcg inhalation 2 puffs inhaled b.i.d.  2.  Amiodarone 200 mg 4 tablets p.o. daily.  3.  Diltiazem 120 mg extended-release capsule 1 capsule p.o. daily.  4.  Oxycodone 5 mg 1 tab every 6 hours as needed.  5.  Potassium chloride 20 mEq 1 tablet p.o. b.i.d.   ALLERGIES:  No known drug allergies.   PERTINENT LABORATORY RESULTS AND RADIOGRAPHIC FINDINGS:  Serum glucose is 118, BUN 12, creatinine 0.71, serum sodium 136, potassium 3.6, chloride 96, bicarbonate 33, and calcium is 7.9. Troponin is more than 40. CK-MB is more than 208. White blood cell count is 15.2, hemoglobin 12.8, hematocrit 39.5, platelet count  116,000, and MCV is 90. Chest x-[place] shows findings suggestive of airway disease with no focal airspace opacities to suggest pneumonia.   PHYSICAL EXAMINATION: VITAL SIGNS:  Temperature is 98.6, pulse 117, respirations 18, blood pressure 129/74, and pulse oximetry is 94% on 2 liters of oxygen via  nasal cannula.  GENERAL:  The patient is alert and oriented. She admits that she is uncomfortable due to her position in bed, but otherwise is in no apparent distress.  HEENT:  Normocephalic, atraumatic. Pupils are equal, round, and reactive to light and accommodation. Extraocular movements are intact. Mucous membranes are moist, but the patient's eyes do appear sunken, and the patient's face has mild pallor.  NECK:  Trachea is midline. No adenopathy. Thyroid is nonpalpable and nontender.  CHEST:  Symmetric and atraumatic.  CARDIOVASCULAR:  Tachycardic rate, normal rhythm. Normal S1 and S2. No rubs, clicks, or murmurs appreciated. The patient has 2+ dorsalis pedis pulses bilaterally. She has no carotid bruits.  LUNGS:  The patient has soft expiratory wheezes in the upper lung fields more than lower bilaterally. She has normal effort and excursion.  ABDOMEN:  Positive bowel sounds. Soft, nontender, nondistended. No hepatosplenomegaly.  GENITOURINARY:  Deferred.  MUSCULOSKELETAL:  The patient moves all 4 extremities equally. There is 5/5 strength in upper and lower extremities bilaterally.  SKIN:  Warm and dry. No rashes or lesions.  EXTREMITIES:  No clubbing or cyanosis. The patient does have some nonpitting edema of both lower extremities, left more than right.  NEUROLOGIC:  Cranial nerves II through XII are grossly intact.  PSYCHIATRIC:  Mood is normal. Affect is congruent. The patient has excellent insight and judgment into her medical condition.   ASSESSMENT AND PLAN:  This is a 64 year old female admitted for non-ST elevation myocardial infarction.   1.  Non-ST elevation myocardial infarction. The patient was incidentally found to have a grossly elevated troponin. Notably, this is following a heart catheterization 6 days ago that was essentially clean from a coronary artery standpoint. She has a preserved yet elevated ejection fraction, which leads one to question the stroke volume that may  indeed be decreased. She also has some nonspecific EKG changes compared to the precatheterization EKG, but notably the patient had been started on amiodarone since that time, which makes the tracing more difficult to interpret. We have started a heparin drip, and I have ordered a repeat echocardiogram in the morning. Dr. Welton FlakesKhan has been notified and will consult for cardiology in the morning. I have also ordered a recheck of the patient's hemoglobin and hematocrit for the morning due to her facial pallor as well as potential risk for arterial dissection in light of a recent heart catheterization and current non-ST elevation myocardial infarction.  2.  Chronic obstructive pulmonary disease. The patient is currently wheezing. We will do DuoNebs every 6 hours as needed while she is awake. Continue Advair.  3.  Hypertension, controlled at this time. I have started a beta blocker, however, for cardiovascular risk reduction. Also, per my interpretation, the patient's cardiac silhouette is somewhat enlarged. This raises concern for cardiomyopathy or myocarditis given the patient's new onset congestive heart failure with her last hospitalization as well as her dramatic decline in overall health.  4.  Atrial fibrillation. This is new since the patient's last admission. Apparently, she had some episodes of rapid ventricular response, but only paroxysmal irregularity, which is presumably the reason she is not on any anticoagulants since she was discharged from the hospital. We will continue diltiazem  and amiodarone.  5.  Obesity. The patient's body mass index is 33.1. I have encouraged a balanced diet to help her lose weight and reduce risk of further cardiovascular problems.  6.  Deep vein thrombosis prophylaxis. The patient is on therapeutic heparin at this time.  7.  Gastrointestinal prophylaxis. None. The patient can have an H2-blocker prior to meals if needed.   CODE STATUS:  The patient is a full code.   Time  spent on admission orders and patient care:  Approximately 45 minutes.   ____________________________ Kelton Pillar. Sheryle Hail, MD msd:nb D: 12/27/2014 04:29:15 ET T: 12/27/2014 04:46:28 ET JOB#: [id number]  cc: Kelton Pillar. Sheryle Hail, MD, <Dictator> Kelton Pillar Ethne Jeon MD ELECTRONICALLY SIGNED 01/07/2015 2:11

## 2015-05-06 IMAGING — CR DG CHEST 1V PORT
1 series · 1 of 1 positions shown · non-contrast
Comparison: none

[ap]
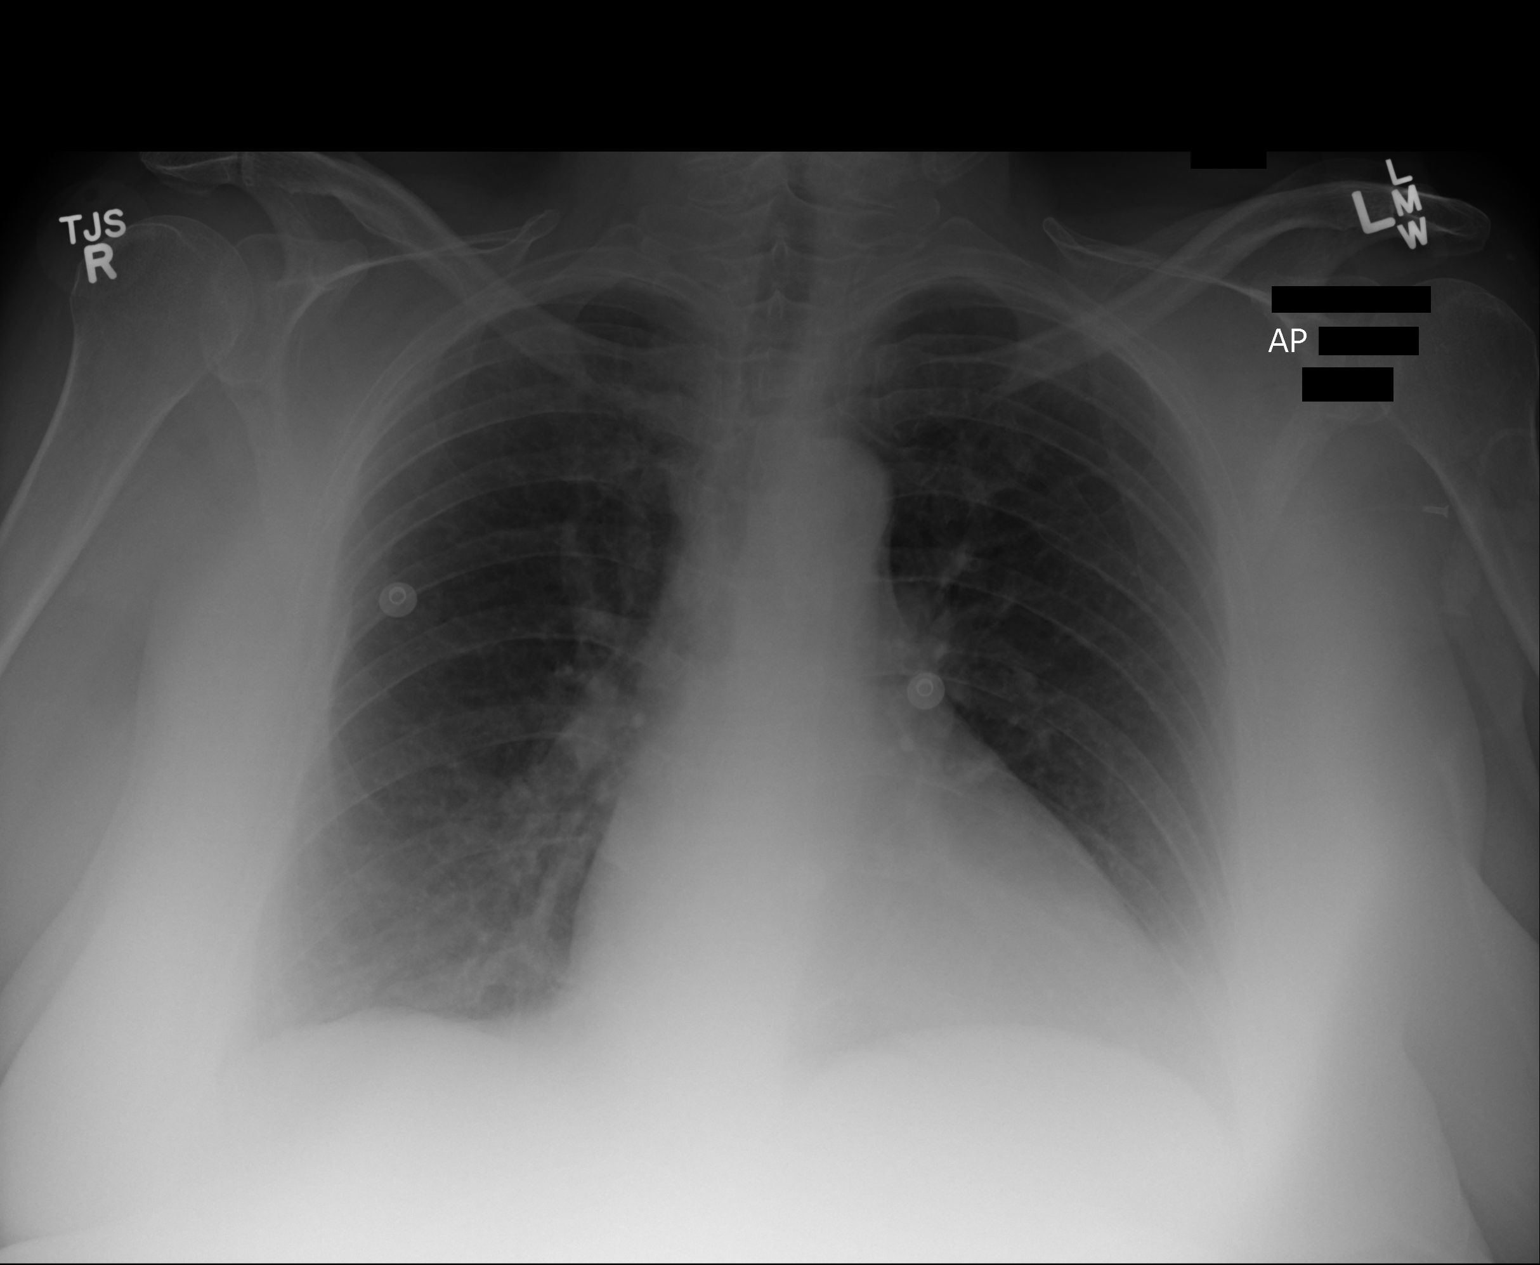

[1 of 1 positions shown; findings below may reference images not displayed]

CLINICAL DATA
Chest pain.

EXAM
PORTABLE CHEST - 1 VIEW

COMPARISON
February 02, 2013.

FINDINGS
The heart size and mediastinal contours are within normal limits.
Both lungs are clear. No pneumothorax or pleural effusion is noted.
Old left sixth rib fracture is again noted.

IMPRESSION
No acute cardiopulmonary abnormality seen.

SIGNATURE

## 2016-02-26 IMAGING — CR DG CHEST 2V
1 series · 2 of 2 positions shown · non-contrast
Comparison: PA and lateral chest x-ray November 08, 2014

CLINICAL DATA: Shortness of breath.

EXAM:
CHEST  2 VIEW

[Series 1: dxr chest pa (or ap) and lateral · 0.14mm/px · 2 of 2 slices shown]
[im 1/2]
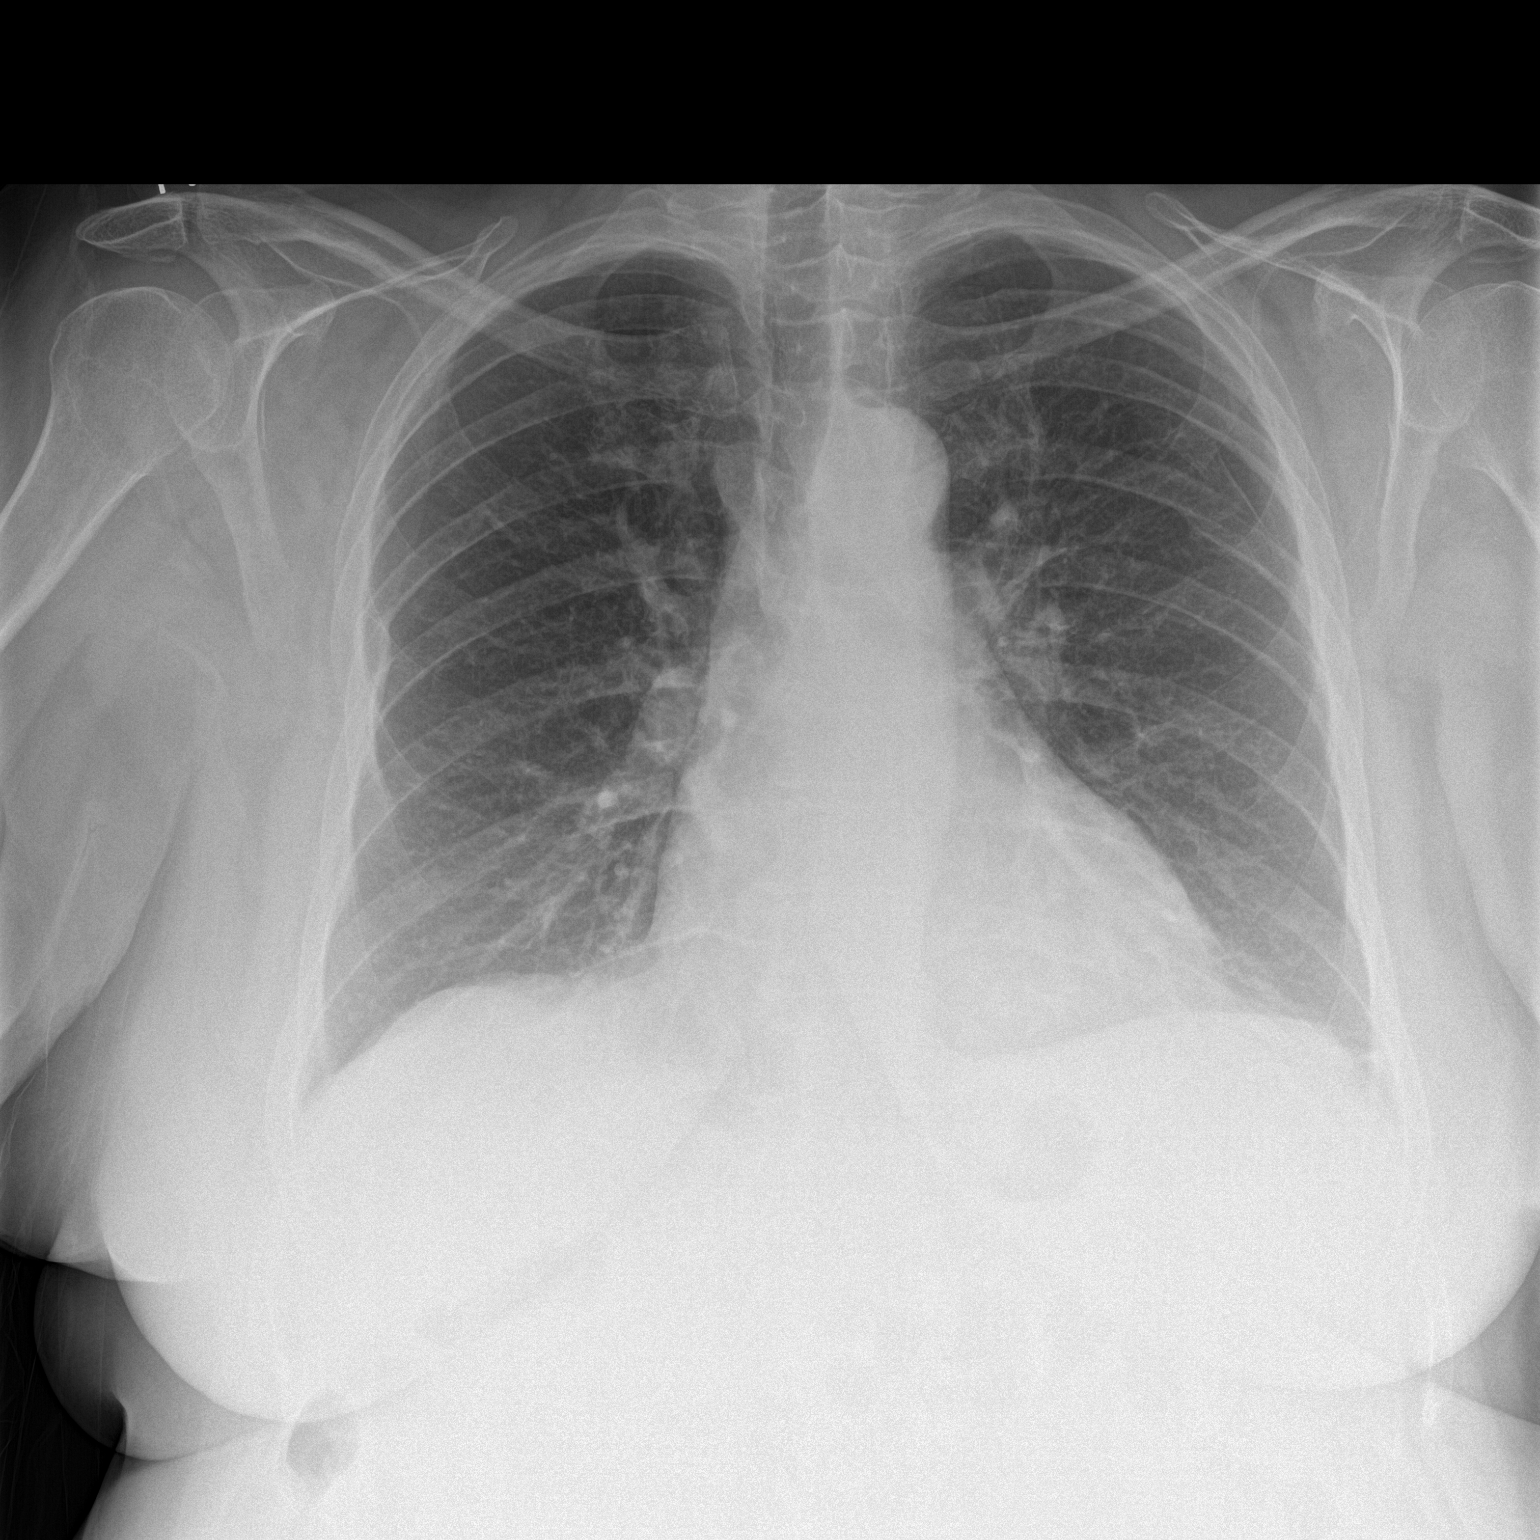
[im 2/2]
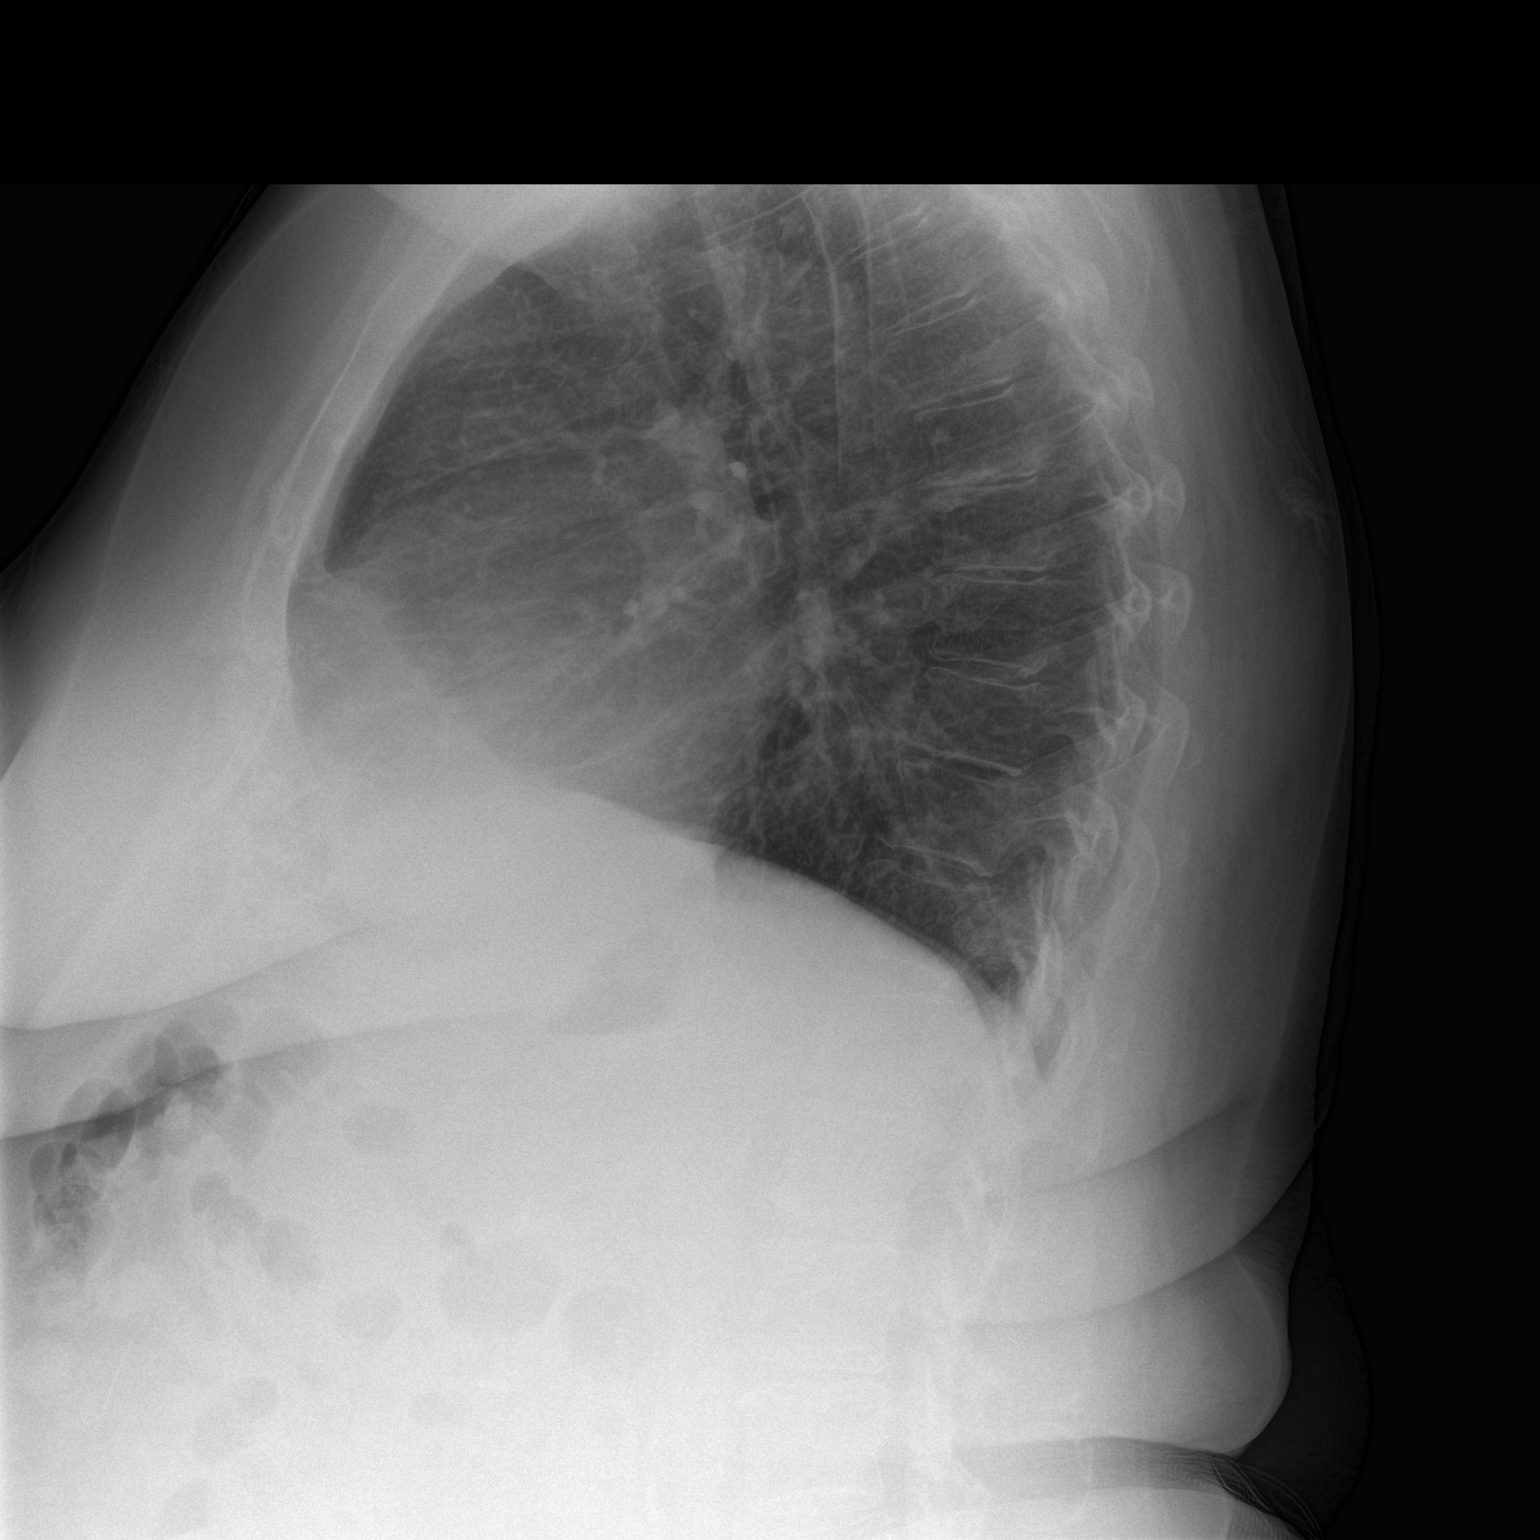

[2 of 2 positions shown; findings below may reference images not displayed]

FINDINGS: The lungs are mildly hyperinflated with hemidiaphragm flattening.
There is minimal increased density at the left lung base. The
interstitial markings of both lungs are mildly increased though
stable. The cardiac silhouette is top-normal in size but also
stable. The pulmonary vascularity is not engorged. The trachea is
midline. There is stable deformity of the posterior aspect of the
left sixth rib and lateral aspect of the right sixth rib.
IMPRESSION: COPD with minimal left basilar subsegmental atelectasis is noted.
Chronically increased interstitial markings may reflect a smoking
history if any or could reflect chronic bronchitic change. There is
no focal pneumonia.

## 2016-03-04 IMAGING — CR DG CHEST 2V
1 series · 2 of 2 positions shown · non-contrast
Comparison: 12/12/2014

CLINICAL DATA: Asthma, cough, shortness of Breath, COPD

EXAM:
CHEST  2 VIEW

[Series 1: dxr chest pa (or ap) and lateral · 0.14mm/px · 2 of 2 slices shown]
[im 1/2]
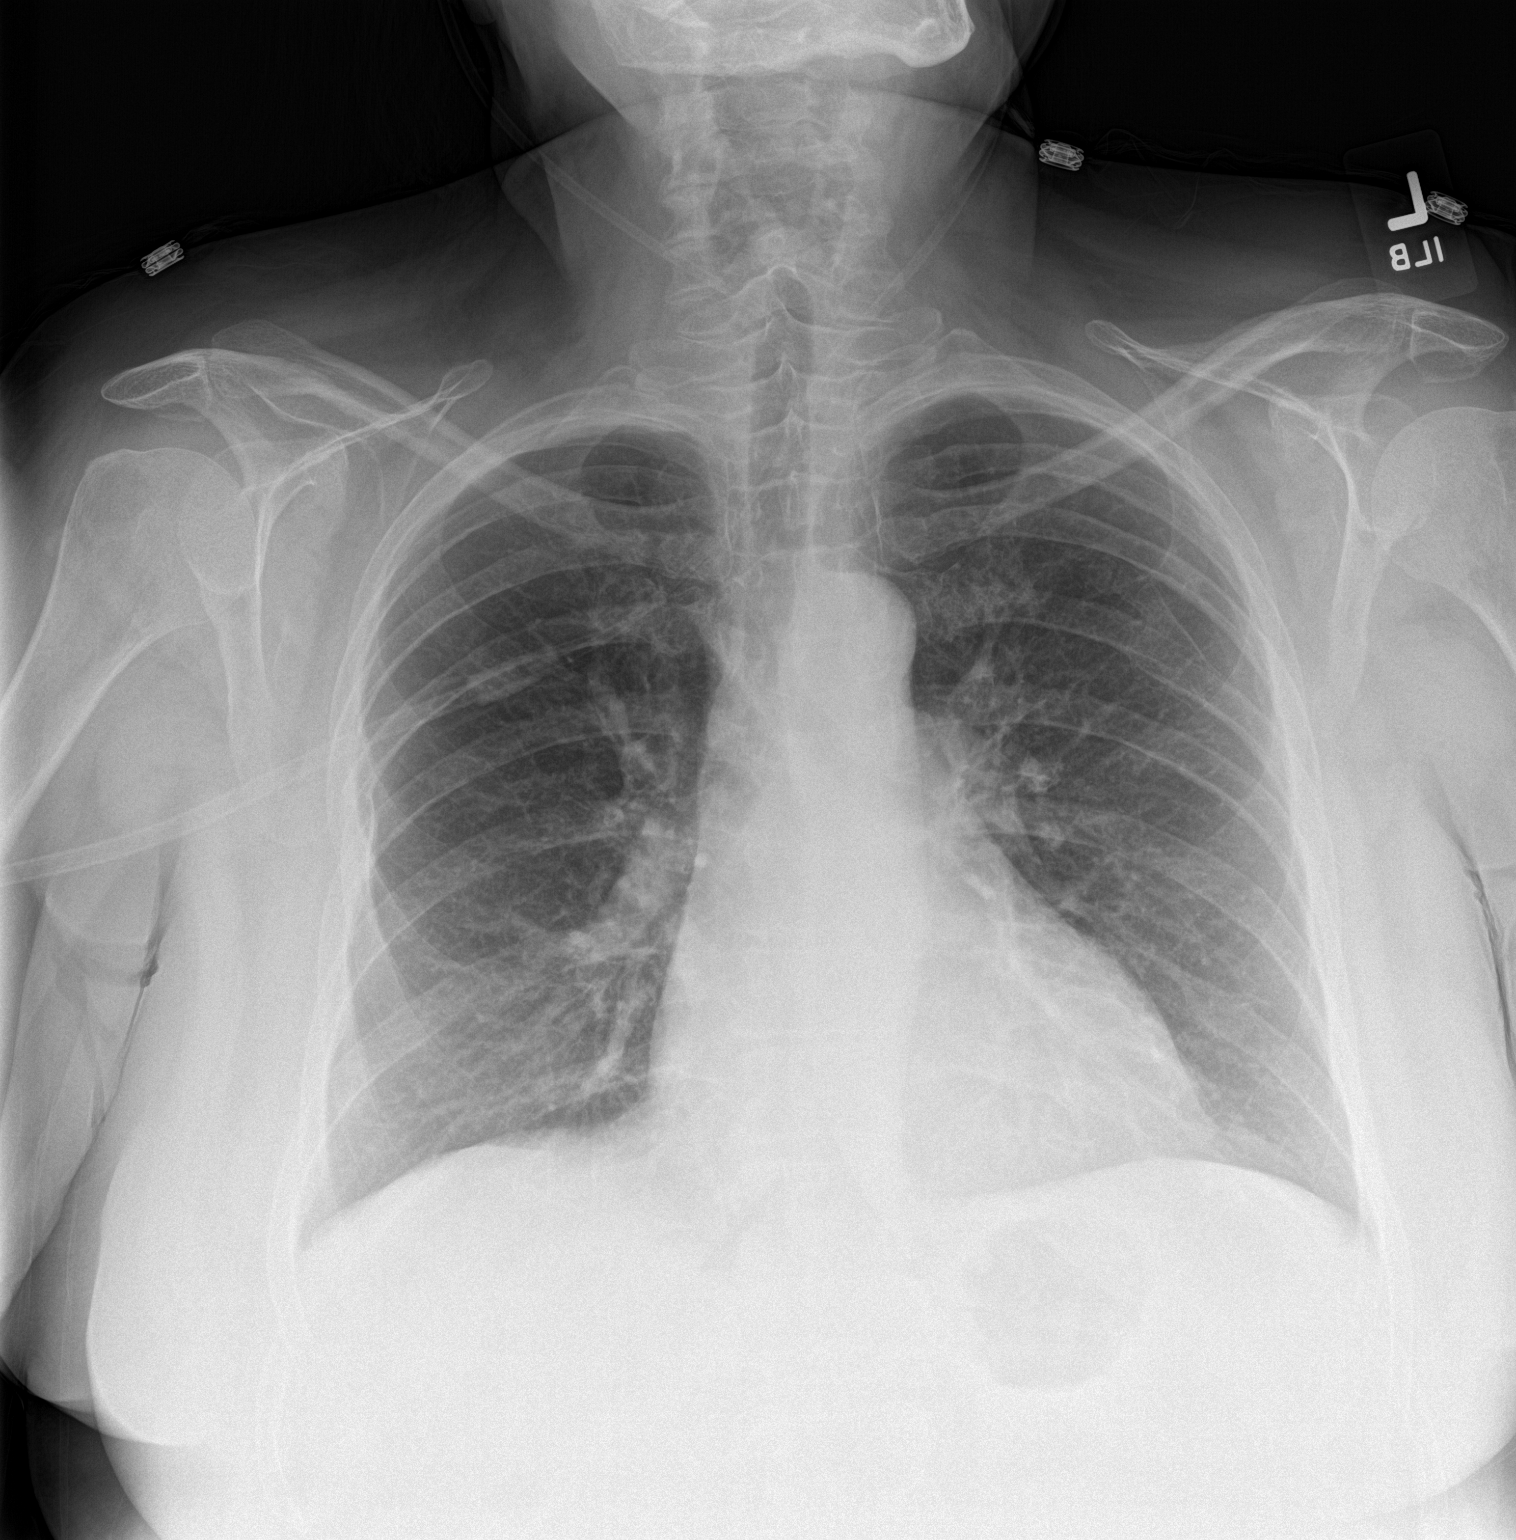
[im 2/2]
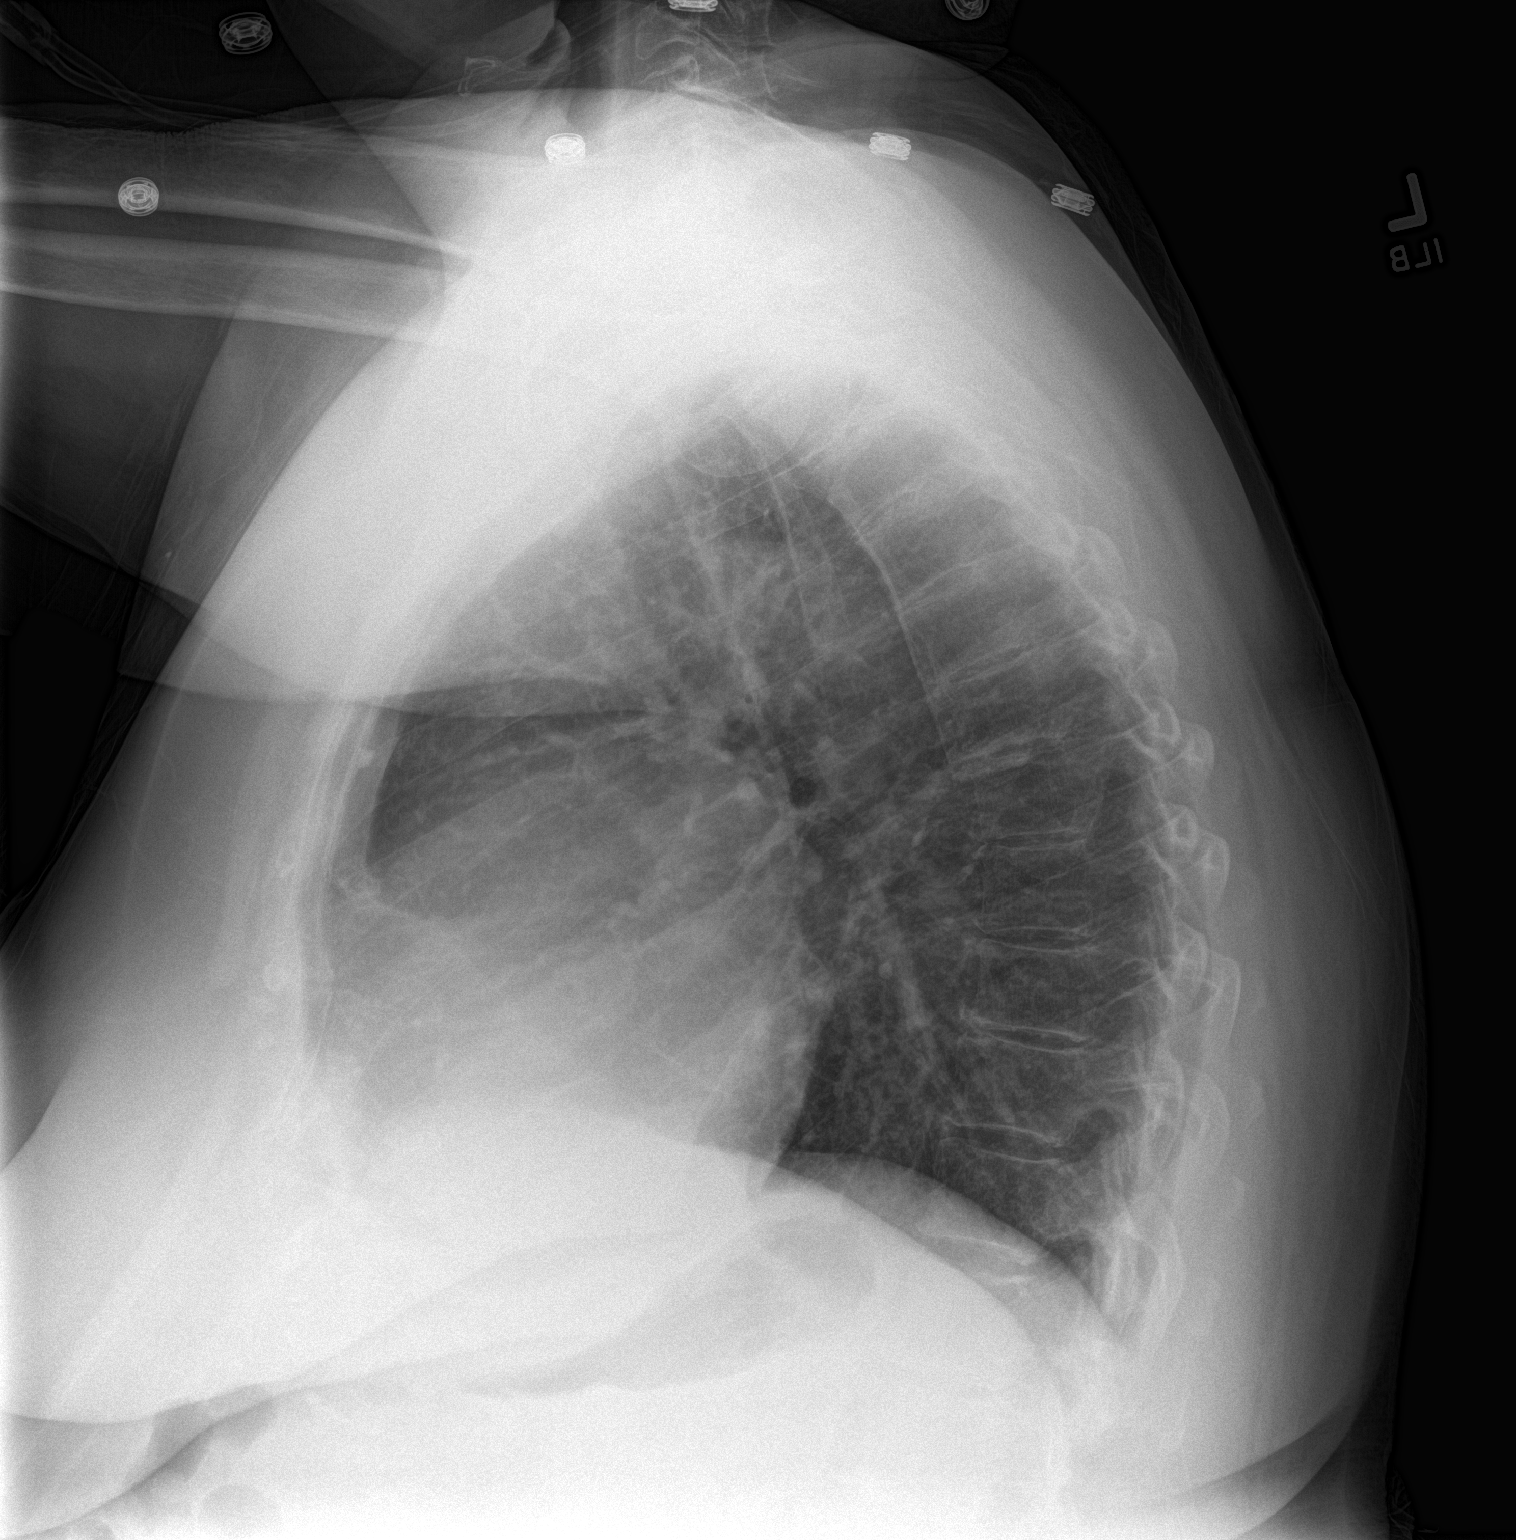

[2 of 2 positions shown; findings below may reference images not displayed]

FINDINGS: Cardiomediastinal silhouette is stable. No acute infiltrate or
pleural effusion. No pulmonary edema. Stable old fracture of the
left fifth rib. Mild hyperinflation again noted. Mild degenerative
changes mid thoracic spine.
IMPRESSION: No active cardiopulmonary disease.  Mild hyperinflation again noted.
